# Patient Record
Sex: Female | Born: 1965 | Race: White | Hispanic: No | Marital: Married | Smoking: Never smoker
Health system: Southern US, Community
[De-identification: ages and names within clinical notes are randomized; demographics above are authoritative.]

## PROBLEM LIST (undated history)

## (undated) DIAGNOSIS — E063 Autoimmune thyroiditis: Secondary | ICD-10-CM

## (undated) DIAGNOSIS — Z9889 Other specified postprocedural states: Secondary | ICD-10-CM

## (undated) DIAGNOSIS — R112 Nausea with vomiting, unspecified: Secondary | ICD-10-CM

## (undated) DIAGNOSIS — Z9221 Personal history of antineoplastic chemotherapy: Secondary | ICD-10-CM

## (undated) DIAGNOSIS — K219 Gastro-esophageal reflux disease without esophagitis: Secondary | ICD-10-CM

## (undated) DIAGNOSIS — E039 Hypothyroidism, unspecified: Secondary | ICD-10-CM

## (undated) DIAGNOSIS — C50919 Malignant neoplasm of unspecified site of unspecified female breast: Secondary | ICD-10-CM

## (undated) DIAGNOSIS — E079 Disorder of thyroid, unspecified: Secondary | ICD-10-CM

## (undated) HISTORY — PX: OTHER SURGICAL HISTORY: SHX169

## (undated) HISTORY — DX: Disorder of thyroid, unspecified: E07.9

## (undated) HISTORY — PX: ABDOMINAL HYSTERECTOMY: SHX81

---

## 1998-12-31 ENCOUNTER — Other Ambulatory Visit: Admission: RE | Admit: 1998-12-31 | Discharge: 1998-12-31 | Payer: Self-pay

## 2000-01-08 ENCOUNTER — Other Ambulatory Visit: Admission: RE | Admit: 2000-01-08 | Discharge: 2000-01-08 | Payer: Self-pay | Admitting: Obstetrics & Gynecology

## 2000-12-09 ENCOUNTER — Other Ambulatory Visit: Admission: RE | Admit: 2000-12-09 | Discharge: 2000-12-09 | Payer: Self-pay | Admitting: Obstetrics & Gynecology

## 2001-11-21 ENCOUNTER — Other Ambulatory Visit: Admission: RE | Admit: 2001-11-21 | Discharge: 2001-11-21 | Payer: Self-pay | Admitting: Obstetrics & Gynecology

## 2002-12-12 ENCOUNTER — Other Ambulatory Visit: Admission: RE | Admit: 2002-12-12 | Discharge: 2002-12-12 | Payer: Self-pay | Admitting: Obstetrics & Gynecology

## 2003-12-10 ENCOUNTER — Other Ambulatory Visit: Admission: RE | Admit: 2003-12-10 | Discharge: 2003-12-10 | Payer: Self-pay | Admitting: Obstetrics & Gynecology

## 2005-03-12 ENCOUNTER — Other Ambulatory Visit: Admission: RE | Admit: 2005-03-12 | Discharge: 2005-03-12 | Payer: Self-pay | Admitting: Obstetrics & Gynecology

## 2008-06-19 ENCOUNTER — Encounter: Admission: RE | Admit: 2008-06-19 | Discharge: 2008-06-19 | Payer: Self-pay | Admitting: Obstetrics & Gynecology

## 2008-12-25 ENCOUNTER — Encounter: Admission: RE | Admit: 2008-12-25 | Discharge: 2008-12-25 | Payer: Self-pay | Admitting: Obstetrics & Gynecology

## 2009-06-11 ENCOUNTER — Encounter: Admission: RE | Admit: 2009-06-11 | Discharge: 2009-06-11 | Payer: Self-pay | Admitting: Obstetrics & Gynecology

## 2009-08-17 HISTORY — PX: MASTECTOMY: SHX3

## 2009-11-21 ENCOUNTER — Encounter: Admission: RE | Admit: 2009-11-21 | Discharge: 2009-11-21 | Payer: Self-pay | Admitting: Obstetrics & Gynecology

## 2010-05-20 ENCOUNTER — Encounter: Admission: RE | Admit: 2010-05-20 | Discharge: 2010-05-20 | Payer: Self-pay | Admitting: Obstetrics & Gynecology

## 2010-09-23 ENCOUNTER — Other Ambulatory Visit: Payer: Self-pay | Admitting: Endocrinology

## 2010-09-23 DIAGNOSIS — E049 Nontoxic goiter, unspecified: Secondary | ICD-10-CM

## 2010-11-17 ENCOUNTER — Ambulatory Visit
Admission: RE | Admit: 2010-11-17 | Discharge: 2010-11-17 | Disposition: A | Discharge status: Home / Self Care | Payer: BC Managed Care – HMO | Source: Ambulatory Visit | Attending: Endocrinology | Admitting: Endocrinology

## 2010-11-17 DIAGNOSIS — E049 Nontoxic goiter, unspecified: Secondary | ICD-10-CM

## 2013-03-02 ENCOUNTER — Other Ambulatory Visit: Payer: Self-pay | Admitting: Obstetrics & Gynecology

## 2013-03-02 DIAGNOSIS — N6452 Nipple discharge: Secondary | ICD-10-CM

## 2013-03-17 ENCOUNTER — Other Ambulatory Visit: Payer: Self-pay | Admitting: Obstetrics & Gynecology

## 2013-03-17 ENCOUNTER — Ambulatory Visit
Admission: RE | Admit: 2013-03-17 | Discharge: 2013-03-17 | Disposition: A | Discharge status: Home / Self Care | Payer: BC Managed Care – PPO | Source: Ambulatory Visit | Attending: Obstetrics & Gynecology | Admitting: Obstetrics & Gynecology

## 2013-03-17 DIAGNOSIS — N6452 Nipple discharge: Secondary | ICD-10-CM

## 2013-03-20 ENCOUNTER — Ambulatory Visit
Admission: RE | Admit: 2013-03-20 | Discharge: 2013-03-20 | Disposition: A | Discharge status: Home / Self Care | Payer: BC Managed Care – PPO | Source: Ambulatory Visit | Attending: Obstetrics & Gynecology | Admitting: Obstetrics & Gynecology

## 2013-03-20 ENCOUNTER — Other Ambulatory Visit: Payer: Self-pay | Admitting: Obstetrics & Gynecology

## 2013-03-20 DIAGNOSIS — N6452 Nipple discharge: Secondary | ICD-10-CM

## 2013-03-20 DIAGNOSIS — C50912 Malignant neoplasm of unspecified site of left female breast: Secondary | ICD-10-CM

## 2013-03-20 DIAGNOSIS — N6489 Other specified disorders of breast: Secondary | ICD-10-CM

## 2013-03-21 ENCOUNTER — Telehealth: Payer: Self-pay | Admitting: *Deleted

## 2013-03-21 DIAGNOSIS — C50512 Malignant neoplasm of lower-outer quadrant of left female breast: Secondary | ICD-10-CM | POA: Insufficient documentation

## 2013-03-21 NOTE — Telephone Encounter (Signed)
Called and spoke with patient and confirmed [facility] appt. For 03/29/13.  Instructions and contact information given.

## 2013-03-24 ENCOUNTER — Ambulatory Visit
Admission: RE | Admit: 2013-03-24 | Discharge: 2013-03-24 | Disposition: A | Discharge status: Home / Self Care | Payer: BC Managed Care – PPO | Source: Ambulatory Visit | Attending: Obstetrics & Gynecology | Admitting: Obstetrics & Gynecology

## 2013-03-24 DIAGNOSIS — C50912 Malignant neoplasm of unspecified site of left female breast: Secondary | ICD-10-CM

## 2013-03-24 MED ORDER — GADOBENATE DIMEGLUMINE 529 MG/ML IV SOLN: 13.0000 mL | Freq: Once | INTRAVENOUS | Status: AC | PRN

## 2013-03-29 ENCOUNTER — Encounter: Payer: Self-pay | Admitting: Oncology

## 2013-03-29 ENCOUNTER — Encounter: Payer: Self-pay | Admitting: *Deleted

## 2013-03-29 ENCOUNTER — Other Ambulatory Visit (HOSPITAL_BASED_OUTPATIENT_CLINIC_OR_DEPARTMENT_OTHER): Payer: BC Managed Care – PPO | Admitting: Lab

## 2013-03-29 ENCOUNTER — Ambulatory Visit (HOSPITAL_BASED_OUTPATIENT_CLINIC_OR_DEPARTMENT_OTHER): Payer: BC Managed Care – PPO | Admitting: Oncology

## 2013-03-29 ENCOUNTER — Ambulatory Visit (HOSPITAL_BASED_OUTPATIENT_CLINIC_OR_DEPARTMENT_OTHER): Payer: BC Managed Care – PPO | Admitting: General Surgery

## 2013-03-29 ENCOUNTER — Ambulatory Visit
Admission: RE | Admit: 2013-03-29 | Discharge: 2013-03-29 | Disposition: A | Discharge status: Home / Self Care | Payer: BC Managed Care – PPO | Source: Ambulatory Visit | Attending: Radiation Oncology | Admitting: Radiation Oncology

## 2013-03-29 ENCOUNTER — Encounter (INDEPENDENT_AMBULATORY_CARE_PROVIDER_SITE_OTHER): Payer: Self-pay | Admitting: General Surgery

## 2013-03-29 ENCOUNTER — Encounter: Payer: Self-pay | Admitting: Radiation Oncology

## 2013-03-29 ENCOUNTER — Encounter: Payer: BC Managed Care – PPO | Admitting: Genetic Counselor

## 2013-03-29 ENCOUNTER — Ambulatory Visit: Payer: BC Managed Care – PPO

## 2013-03-29 VITALS — BP 157/90 | HR 92 | Temp 98.4°F | Resp 18 | Ht 65.0 in | Wt 137.0 lb

## 2013-03-29 DIAGNOSIS — C50519 Malignant neoplasm of lower-outer quadrant of unspecified female breast: Secondary | ICD-10-CM

## 2013-03-29 DIAGNOSIS — C50512 Malignant neoplasm of lower-outer quadrant of left female breast: Secondary | ICD-10-CM

## 2013-03-29 DIAGNOSIS — Z170 Estrogen receptor positive status [ER+]: Secondary | ICD-10-CM

## 2013-03-29 DIAGNOSIS — C50919 Malignant neoplasm of unspecified site of unspecified female breast: Secondary | ICD-10-CM

## 2013-03-29 LAB — COMPREHENSIVE METABOLIC PANEL (CC13)
ALT: 14 U/L (ref 0–55)
Albumin: 4.3 g/dL (ref 3.5–5.0)
Alkaline Phosphatase: 58 U/L (ref 40–150)
Glucose: 94 mg/dl (ref 70–140)
Potassium: 4.1 mEq/L (ref 3.5–5.1)
Sodium: 142 mEq/L (ref 136–145)
Total Bilirubin: 0.55 mg/dL (ref 0.20–1.20)
Total Protein: 7.3 g/dL (ref 6.4–8.3)

## 2013-03-29 LAB — CBC WITH DIFFERENTIAL/PLATELET
BASO%: 0.4 % (ref 0.0–2.0)
Eosinophils Absolute: 0 10*3/uL (ref 0.0–0.5)
MCHC: 33.7 g/dL (ref 31.5–36.0)
MCV: 89 fL (ref 79.5–101.0)
MONO#: 0.3 10*3/uL (ref 0.1–0.9)
MONO%: 4.2 % (ref 0.0–14.0)
NEUT#: 5.5 10*3/uL (ref 1.5–6.5)
RBC: 4.62 10*6/uL (ref 3.70–5.45)
RDW: 12.9 % (ref 11.2–14.5)
WBC: 7.6 10*3/uL (ref 3.9–10.3)

## 2013-03-29 NOTE — Progress Notes (Signed)
Checked in new patient with no financial issues. Didn't ask if had packet and if living will or POA. She gave email address but said mail and phone are ok for communication.

## 2013-03-29 NOTE — Progress Notes (Signed)
Radiation Oncology         [phone]) [phone] ________________________________  Initial outpatient Consultation  Name: Amanda Coleman MRN: [id number]  Date: 03/29/2013  DOB: April 13, 1966  WJ:XBJY,N RONALD, MD  Emelia Loron, MD   REFERRING PHYSICIAN: Emelia Loron, MD  DIAGNOSIS: Clinical T2, N0, M0 left breast cancer, ER/PR positive HER-2/neu negative grade 1  HISTORY OF PRESENT ILLNESS::Amanda Coleman is a 47 y.o. female who presented with scant left breast bloody discharge x 3-4 mo.  Mammogram showed dense breast tissue.  On [place], a left breast palpable mass was 3.4cm at 6:00.    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  PREVIOUS RADIATION THERAPY: No  PAST MEDICAL HISTORY:  has a past medical history of Thyroid disease.    PAST SURGICAL HISTORY: Past Surgical History  Procedure Laterality Date  . Abdominal hysterectomy    . Gum graft      FAMILY HISTORY: family history includes Lung cancer in her maternal aunt; Ovarian cancer in her maternal grandmother; Pancreatic cancer in her father; Uterine cancer in her maternal grandmother.  SOCIAL HISTORY:  reports that she has never smoked. She does not have any smokeless tobacco history on file. She reports that she does not drink alcohol or use illicit drugs.  ALLERGIES: Penicillins and Sulfa antibiotics  MEDICATIONS:  Current Outpatient Prescriptions  Medication Sig Dispense Refill  . ALPRAZolam (XANAX) 0.5 MG tablet Take 0.5 mg by mouth as needed for sleep.      . clindamycin (CLEOCIN) 150 MG capsule       . Levothyroxine Sodium 112 MCG CAPS Take 1 each by mouth daily before breakfast.      . mupirocin ointment (BACTROBAN) 2 %       . zolpidem (AMBIEN) 5 MG tablet Take 5 mg by mouth at bedtime as  needed for sleep.       No current facility-administered medications for this encounter.    REVIEW OF SYSTEMS:  Pertinent items are noted in HPI.   PHYSICAL EXAM:   Vitals - 1 value per visit 03/29/2013  SYSTOLIC 157  DIASTOLIC 90  PULSE 92  TEMPERATURE 98.4  RESPIRATIONS 18  Weight (lb) 137  HEIGHT 5\' 5"   BMI 22.8  VISIT REPORT    General: Alert and oriented, in no acute distress HEENT: Head is normocephalic.Extraocular movements are intact. Oropharynx is clear. Neck: Neck is supple, no palpable cervical or supraclavicular lymphadenopathy. Heart: Regular in rate and rhythm with no murmurs, rubs, or gallops. Chest: Clear to auscultation bilaterally, with no rhonchi, wheezes, or rales. Abdomen: Soft, nontender, nondistended, with no rigidity or guarding. Extremities: No cyanosis or edema. Lymphatics: No concerning lymphadenopathy. Skin: No concerning lesions. Musculoskeletal: symmetric strength and muscle tone throughout. Neurologic: Cranial nerves II through XII are grossly intact. No obvious focalities. Speech is fluent. Coordination is intact. Psychiatric: Judgment and insight are intact. Affect is appropriate. Breasts: In the lower outer quadrant of the left breast she has a palpable abnormality of approximately 4 cm in greatest dimension. There is associated ecchymoses from biopsy. Otherwise no palpable lesions of concern in either breast or axillary regions    LABORATORY DATA:  Lab Results  Component Value Date   WBC 7.6 03/29/2013   HGB 13.8 03/29/2013   HCT 41.1 03/29/2013  MCV 89.0 03/29/2013   PLT 214 03/29/2013   CMP     Component Value Date/Time   NA 142 03/29/2013 1225   K 4.1 03/29/2013 1225   CO2 23 03/29/2013 1225   GLUCOSE 94 03/29/2013 1225   BUN 8.7 03/29/2013 1225   CREATININE 0.8 03/29/2013 1225   CALCIUM 9.5 03/29/2013 1225   PROT 7.3 03/29/2013 1225   ALBUMIN 4.3 03/29/2013 1225   AST 17 03/29/2013 1225   ALT 14 03/29/2013 1225   ALKPHOS 58 03/29/2013  1225   BILITOT 0.55 03/29/2013 1225         RADIOGRAPHY: US Breast Left  03/17/2013   *RADIOLOGY REPORT*  Clinical Data:  47 year old female with left-sided bloody nipple discharge for approximately 3-4 months.  DIGITAL DIAGNOSTIC LEFT MAMMOGRAM  AND  LEFT BREAST ULTRASOUND:  Comparison:  Previous mammograms  Findings:  ACR Breast Density Category c:  The breast tissue is heterogeneously dense, which may obscure small masses.  CC and MLO views of the left breast were performed to evaluate for spontaneous left-sided bloody nipple discharge.  No definite mammographic abnormalities are seen to account for the left-sided nipple discharge.  Physical examination of the left breast reveals a firm mass at the approximate 6 o'clock position measuring approximately 3-4 cm.  Targeted ultrasound of the subareolar left breast did not reveal any intraductal masses.  Ultrasound of the left breast at approximately six to seven o'clock demonstrates an irregular hypoechoic mass measuring approximately 3.4 x 0.9 x 1.7 cm.  No suspicious lymphadenopathy is seen in the left axilla.  IMPRESSION: Suspicious left breast mass at six to seven o'clock in the left breast measuring up to 3.4 cm.  RECOMMENDATION: Ultrasound-guided biopsy is recommended and this is scheduled for later today 03/17/2013.  I have discussed the findings and recommendations with the patient. Results were also provided in writing at the conclusion of the visit.  If applicable, a reminder letter will be sent to the patient regarding the next appointment.  BI-RADS CATEGORY 5:  Highly suggestive of malignancy - appropriate action should be taken.   Original Report Authenticated By: Edwin Cap, M.D.   Mr Breast Bilateral W Wo Contrast  03/24/2013   *RADIOLOGY REPORT*  Clinical Data:Biopsy-proven left breast cancer six o'clock location.  The patient presented with bloody nipple discharge.  BILATERAL BREAST MRI WITH AND WITHOUT CONTRAST  Technique: Multiplanar,  multisequence MR images of both breasts were obtained prior to and following the intravenous administration of 13ml of Multihance.  THREE-DIMENSIONAL MR IMAGE RENDERING ON INDEPENDENT WORKSTATION: Three-dimensional MR images were rendered by post-processing  of the original MR data on an independent DynaCad workstation. The three-dimensional MR images were interpreted, and findings are reported in the following complete MRI report for this study.  Comparison:  Recent imaging examinations.  FINDINGS:  Breast composition:  d:  The breast tissue is extremely dense, which lowers the sensitivity of mammography.  Background parenchymal enhancement: Marked  Right breast:  No mass or abnormal enhancement.  Left breast:  There is a region of irregular asymmetric enhancement with internal predominant plateau type enhancement kinetics in the left breast six o'clock location with central clip artifact, corresponding to the biopsy-proven breast cancer.  The area of asymmetrically prominent mass-like enhancement is larger than the area seen on ultrasound and measures 5.0 x 3.3 x 2.6 cm.  Lymph nodes:  No abnormal appearing lymph nodes.  Ancillary findings:  None.  IMPRESSION: 5.0 cm area of abnormally asymmetric enhancement with internal clip artifact  in the left breast six o'clock location corresponding to biopsy-proven breast cancer; the area of enhancement suggests the area of involvement is larger than that apparent at ultrasound and mammography, which may in part be due to the patient's dense breast parenchyma.  No other area of abnormal enhancement in either breast, allowing for marked background enhancement in extremely dense breast parenchymal pattern.  RECOMMENDATION: Treatment plan  BI-RADS CATEGORY 6:  Known biopsy-proven malignancy - appropriate action should be taken.   Original Report Authenticated By: Christiana Pellant, M.D.   Mm Digital Diagnostic Unilat L  03/17/2013   *RADIOLOGY REPORT*  Clinical Data:  Status  post ultrasound guided biopsy of the left breast mass at six to seven o'clock.  DIGITAL DIAGNOSTIC LEFT MAMMOGRAM  Comparison:  Previous exams.  Findings:  Films are performed following ultrasound-guided biopsy of the left breast mass at 6-7 o'clock. A top hat is clip is present in the left breast, in appropriate position.  IMPRESSION: Top hat clip in appropriate position post ultrasound guided biopsy of a left breast mass at six to seven o'clock.   Original Report Authenticated By: Edwin Cap, M.D.   Mm Digital Diagnostic Unilat L  03/17/2013   *RADIOLOGY REPORT*  Clinical Data:  47 year old female with left-sided bloody nipple discharge for approximately 3-4 months.  DIGITAL DIAGNOSTIC LEFT MAMMOGRAM  AND  LEFT BREAST ULTRASOUND:  Comparison:  Previous mammograms  Findings:  ACR Breast Density Category c:  The breast tissue is heterogeneously dense, which may obscure small masses.  CC and MLO views of the left breast were performed to evaluate for spontaneous left-sided bloody nipple discharge.  No definite mammographic abnormalities are seen to account for the left-sided nipple discharge.  Physical examination of the left breast reveals a firm mass at the approximate 6 o'clock position measuring approximately 3-4 cm.  Targeted ultrasound of the subareolar left breast did not reveal any intraductal masses.  Ultrasound of the left breast at approximately six to seven o'clock demonstrates an irregular hypoechoic mass measuring approximately 3.4 x 0.9 x 1.7 cm.  No suspicious lymphadenopathy is seen in the left axilla.  IMPRESSION: Suspicious left breast mass at six to seven o'clock in the left breast measuring up to 3.4 cm.  RECOMMENDATION: Ultrasound-guided biopsy is recommended and this is scheduled for later today 03/17/2013.  I have discussed the findings and recommendations with the patient. Results were also provided in writing at the conclusion of the visit.  If applicable, a reminder letter will be  sent to the patient regarding the next appointment.  BI-RADS CATEGORY 5:  Highly suggestive of malignancy - appropriate action should be taken.   Original Report Authenticated By: Edwin Cap, M.D.   Mm Digital Diagnostic Unilat R  03/20/2013   *RADIOLOGY REPORT*  Clinical Data:  Recently diagnosed left breast cancer.  Pre MRI evaluation.  DIGITAL DIAGNOSTIC RIGHT MAMMOGRAM WITH CAD  Comparison: Previous examinations.  Findings:  ACR Breast Density Category d:  The breast tissue is extremely dense, which lowers the sensitivity of mammography.  Mammographic views of the right breast demonstrate normal appearing breast tissue with no findings suspicious for malignancy.  Mammographic images were processed with CAD.  IMPRESSION: No evidence of malignancy.  RECOMMENDATION: Treatment plan for the patient's recently diagnosed left breast cancer.  I have discussed the findings and recommendations with the patient. Results were also provided in writing at the conclusion of the visit.  If applicable, a reminder letter will be sent to the patient regarding her next appointment.  BI-RADS CATEGORY 1:  Negative.   Original Report Authenticated By: Beckie Salts, M.D.   Mm Radiologist Eval And Mgmt  03/20/2013   *RADIOLOGY REPORT*  ESTABLISHED PATIENT OFFICE VISIT - LEVEL II [id number])  Chief Complaint:  Status post left breast ultrasound guided core needle biopsy.  History:  The patient reported some bleeding following the biopsy which subsequently resolved.  She reports no pain at the biopsy site today.  Exam:  The patient has bruising in the inferior aspect of the left breast, medial to the skin entry site.  No palpable hematoma.  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  Assessment and Plan:  The final pathological diagnosis is concordant with the imaging findings.  The patient was given an appointment for a bilateral breast MR without and with contrast at [facility] at 12:45 p.m. on 03/24/2013.  She will be seen in the Multidisciplinary Clinic on 03/29/2013.   Original Report Authenticated By: Beckie Salts, M.D.   [place] Lt Breast Bx W Loc Dev 1st Lesion Img Bx Spec US Guide  03/17/2013   *RADIOLOGY REPORT*  Clinical Data:  47 year old female with a suspicious left breast mass.  ULTRASOUND GUIDED VACUUM ASSISTED CORE BIOPSY OF THE LEFT BREAST  Comparison: Previous exams.  I met with the patient and we discussed the procedure of ultrasound- guided biopsy, including benefits and alternatives.  We discussed the high likelihood of a successful procedure. We discussed the risks of the procedure including infection, bleeding, tissue injury, clip migration, and inadequate sampling.  Informed written consent was given. The usual time-out protocol was performed immediately prior to the procedure.  Using sterile technique and 2% Lidocaine as local anesthetic, under direct ultrasound visualization, a 12 gauge vacuum-assisted device was used to perform biopsy of the highly suspicious left breast mass at six to seven o'clock using a medial approach. At the conclusion of the procedure, a a top hat tissue marker clip was deployed into the biopsy cavity.  Follow-up 2-view mammogram was performed and dictated separately.  IMPRESSION: Ultrasound-guided biopsy of the highly suspicious left breast mass located at six to seven o'clock.  No apparent complications.   Original Report Authenticated By: Edwin Cap, M.D.      IMPRESSION/PLAN: This is a lovely 47 year old woman with clinical T2, N0, M0 left breast cancer. Since this is a low-grade ER/PR positive cancer it is not likely to respond very well to neoadjuvant chemotherapy.  Therefore recommendations were made for mastectomy, and reconstruction if the patient desires reconstruction. Oncotype testing will determine whether she needs chemotherapy. Radiotherapy would only be indicated after mastectomy in the Chern of a positive  margin, positive lymph node(s), or if the tumor is significantly larger than anticipated. A referral will be made to genetics.  It was a pleasure meeting the patient today. We discussed the risks, benefits, and side effects of radiotherapy; however, I think it's unlikely that she will need radiotherapy after mastectomy. We will discuss her pathology results at the multidisciplinary conference. I wished her the very best.   I spent 25 minutes  face to face with the patient and more than 50% of that time was spent in counseling and/or coordination of care.    __________________________________________   Lonie Peak, MD

## 2013-03-29 NOTE — Progress Notes (Signed)
Patient ID: Amanda Coleman, female   DOB: 1966/01/10, 47 y.o.   MRN: [id number]  Chief Complaint  Patient presents with  . breast cancer    HPI Amanda Coleman is a 47 y.o. female.  Referred by Dr Konrad Dolores HPI 55 yof who was scheduling routine annual exam with Dr Jennette Kettle and told his nurse she had been having some bloody left nipple discharge.  She was seen quickly and referred for mm.  Her mm does not really show an abnormality as her breasts are extremely dense.  An u/s was performed in area of a palpable mass that showed at least a 3.4x0.9x1.7 cm mass.  MRI shows 5 x3.3x2.6 cm area of asymmetric enhancement but no real mass.  There are no abnormal nodes or right breast abnormalities.  Biopsy was performed that shows grade I invasive ductal carcinoma that is er/pr positive, her 2 negative and Ki is 11%.  She comes in today to discuss her options.  Past Medical History  Diagnosis Date  . Thyroid disease     Past Surgical History  Procedure Laterality Date  . Abdominal hysterectomy    . Gum graft      Family History  Problem Relation Age of Onset  . Pancreatic cancer Father   . Lung cancer Maternal Aunt   . Uterine cancer Maternal Grandmother   . Ovarian cancer Maternal Grandmother     Social History History  Substance Use Topics  . Smoking status: Never Smoker   . Smokeless tobacco: Not on file  . Alcohol Use: No    Allergies  Allergen Reactions  . Penicillins Swelling  . Sulfa Antibiotics Rash    Current Outpatient Prescriptions  Medication Sig Dispense Refill  . ALPRAZolam (XANAX) 0.5 MG tablet Take 0.5 mg by mouth as needed for sleep.      . clindamycin (CLEOCIN) 150 MG capsule       . Levothyroxine Sodium 112 MCG CAPS Take 1 each by mouth daily before breakfast.      . mupirocin ointment (BACTROBAN) 2 %       . zolpidem (AMBIEN) 5 MG tablet Take 5 mg by mouth at bedtime as needed for sleep.       No current facility-administered medications for this visit.    Review of  Systems Review of Systems  Constitutional: Negative for fever, chills and unexpected weight change.  HENT: Positive for sinus pressure. Negative for hearing loss, congestion, sore throat, trouble swallowing and voice change.   Eyes: Negative for visual disturbance.  Respiratory: Negative for cough and wheezing.   Cardiovascular: Negative for chest pain, palpitations and leg swelling.  Gastrointestinal: Negative for nausea, vomiting, abdominal pain, diarrhea, constipation, blood in stool, abdominal distention and anal bleeding.  Genitourinary: Negative for hematuria, vaginal bleeding and difficulty urinating.  Musculoskeletal: Positive for arthralgias.  Skin: Negative for rash and wound.  Neurological: Negative for seizures, syncope and headaches.  Hematological: Negative for adenopathy. Does not bruise/bleed easily.  Psychiatric/Behavioral: Negative for confusion.    There were no vitals taken for this visit.  Physical Exam Physical Exam  Vitals reviewed. Constitutional: She appears well-developed and well-nourished.  Neck: Neck supple.  Cardiovascular: Normal rate, regular rhythm and normal heart sounds.   Pulmonary/Chest: Effort normal and breath sounds normal. She has no wheezes. She has no rales. Right breast exhibits no inverted nipple, no mass, no nipple discharge, no skin change and no tenderness. Left breast exhibits mass. Left breast exhibits no inverted nipple, no nipple discharge, no  skin change and no tenderness.    Abdominal: Soft.  Lymphadenopathy:    She has no cervical adenopathy.    Data Reviewed BILATERAL BREAST MRI WITH AND WITHOUT CONTRAST  Technique: Multiplanar, multisequence MR images of both breasts  were obtained prior to and following the intravenous administration  of 13ml of Multihance.  THREE-DIMENSIONAL MR IMAGE RENDERING ON INDEPENDENT WORKSTATION:  Three-dimensional MR images were rendered by post-processing of  the original MR data on an  independent DynaCad workstation. The  three-dimensional MR images were interpreted, and findings are  reported in the following complete MRI report for this study.  Comparison: Recent imaging examinations.  FINDINGS:  Breast composition: d: The breast tissue is extremely dense,  which lowers the sensitivity of mammography.  Background parenchymal enhancement: Marked  Right breast: No mass or abnormal enhancement.  Left breast: There is a region of irregular asymmetric enhancement  with internal predominant plateau type enhancement kinetics in the  left breast six o'clock location with central clip artifact,  corresponding to the biopsy-proven breast cancer. The area of  asymmetrically prominent mass-like enhancement is larger than the  area seen on ultrasound and measures 5.0 x 3.3 x 2.6 cm.  Lymph nodes: No abnormal appearing lymph nodes.  Ancillary findings: None.  IMPRESSION:  5.0 cm area of abnormally asymmetric enhancement with internal clip  artifact in the left breast six o'clock location corresponding to  biopsy-proven breast cancer; the area of enhancement suggests the  area of involvement is larger than that apparent at ultrasound and  mammography, which may in part be due to the patient's dense breast  parenchyma. No other area of abnormal enhancement in either  breast, allowing for marked background enhancement in extremely  dense breast parenchymal pattern.   Assessment    Left breast cancer    Plan    Left skin sparing mastectomy, left axillary sentinel node biopsy    I think due to size of mass and breast size (as well as location in lower inner quadrant) it will be difficult to convert her to a lumpectomy. Her tumor is larger but biologically not very aggressive making less likely to shrink with chemotherapy and I think antiestrogen therapy would be unlikely to convert.  I discussed a mastectomy with her today.  I don't think a nipple sparing mastectomy is good  option for her tumor due to her exam and mr.  I do think a skin sparing mastectomy combined with immediate reconstruction would be reasonable. I am also concerned the inability of mm to pick this up and her breast density.  She could be followed with mm and mr though. I will have her see plastic surgery this week for evaluation.  We also discussed role of bilateral mastectomies.  I told her there are some reasons including symmetry and detection but there is probably not a survival advantage (barring a genetic abnormality) for this.  We have decided to try to do genetic testing then proceed likely with above procedure combined with plastic surgery. We discussed the staging and pathophysiology of breast cancer. We discussed all of the different options for treatment for breast cancer including surgery, chemotherapy, radiation therapy, Herceptin, and antiestrogen therapy.   We discussed a sentinel lymph node biopsy as she does not appear to having lymph node involvement right now. We discussed the performance of that with injection of radioactive tracer and blue dye. We discussed that she would have an incision underneath her axillary hairline. We discussed that there is a  bout a 10-20% chance of having a positive node with a sentinel lymph node biopsy and we will await the permanent pathology to make any other first further decisions in terms of her treatment. One of these options might be to return to the operating room to perform an axillary lymph node dissection. We discussed about a 1-2% risk lifetime of chronic shoulder pain as well as lymphedema associated with a sentinel lymph node biopsy.  We discussed that mastectomy does not reduce local recurrence rate to 0. We discussed the risks of operation including bleeding, infection, possible reoperation. She understands her further therapy will be based on what her stages at the time of her operation.     Hisao Doo 03/29/2013, 2:41 PM

## 2013-03-29 NOTE — Progress Notes (Signed)
Amanda Coleman [id number] Apr 27, 1966 47 y.o. 03/29/2013 9:49 PM  CC  Freddy Finner, MD [address] [place] [address] Dr. Lonie Peak Dr. 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 Patient is seen in the multidisciplinary clinic for discussion of treatment options.  STAGE:   Breast cancer of lower-outer quadrant of left female breast   Primary site: Breast (Left)   Staging method: AJCC 7th Edition   Clinical: Stage IIA (T2, N0, cM0)   Summary: Stage IIA (T2, N0, cM0)  REFERRING PHYSICIAN: Dr. Emelia Loron  HISTORY OF PRESENT ILLNESS:  Amanda Coleman is a 47 y.o. female.  Without significant past medical history who for about 3-4 months experienced a left nipple bloody discharge. She recently had a mammogram that did not show a mass. She had a physical examination which did show a mass measuring 3.4 cm at the 7:00 position. MRI revealed the mass to be 5.0 cm. She went on to have a biopsy performed at the 7:00 position that showed invasive ductal carcinoma grade 1. Prognostic markers revealed the tumor to be estrogen receptor positive progesterone receptor positive HER-2/neu negative with a low Ki-67 11%. Issues [place] was discussed at the multidisciplinary breast conference she is now seen in the multidisciplinary breast clinic for discussion of treatment options. Her pathology and radiology were reviewed. She is without any complaints she is accompanied by her husband today.   Past Medical History: Past Medical History  Diagnosis Date  . Thyroid disease     Past Surgical History: Past Surgical History  Procedure Laterality Date  . Abdominal hysterectomy    . Gum graft      Family History: Family History  Problem Relation Age of Onset  . Pancreatic cancer Father   . Lung cancer Maternal Aunt   . Uterine cancer Maternal Grandmother   . Ovarian cancer  Maternal Grandmother     Social History History  Substance Use Topics  . Smoking status: Never Smoker   . Smokeless tobacco: Not on file  . Alcohol Use: No    Allergies: Allergies  Allergen Reactions  . Penicillins Swelling  . Sulfa Antibiotics Rash    Current Medications: Current Outpatient Prescriptions  Medication Sig Dispense Refill  . ALPRAZolam (XANAX) 0.5 MG tablet Take 0.5 mg by mouth as needed for sleep.      . Levothyroxine Sodium 112 MCG CAPS Take 1 each by mouth daily before breakfast.      . zolpidem (AMBIEN) 5 MG tablet Take 5 mg by mouth at bedtime as needed for sleep.      . clindamycin (CLEOCIN) 150 MG capsule       . mupirocin ointment (BACTROBAN) 2 %        No current facility-administered medications for this visit.    OB/GYN History:menarche at age 44 she underwent surgical menopause in November 2010. She is nulliparous she has never been on hormone placement therapy.  Fertility Discussion:not applicable Prior History of Cancer:no  Health Maintenance:  Colonoscopyno Bone Densityyour Last PAP smear10/15/2013  ECOG PERFORMANCE STATUS: 0 - Asymptomatic  Genetic Counseling/testing: yes  REVIEW OF SYSTEMS:  Review of Systems  Constitutional: Negative.   HENT: Positive for congestion. Negative for hearing loss and ear discharge.   Eyes: Negative.   Respiratory: Negative.   Cardiovascular: Negative.   Gastrointestinal: Negative.   Genitourinary: Negative.   Musculoskeletal: Negative.   Skin: Negative.   Neurological:  Negative.  Negative for headaches.  Endo/Heme/Allergies: Negative.   Psychiatric/Behavioral: Negative.      PHYSICAL EXAMINATION: Blood pressure 157/90, pulse 92, temperature 98.4 F (36.9 C), temperature source Oral, resp. rate 18, height 5\' 5"  (1.651 m), weight 137 lb (62.143 kg), SpO2 100.00%.  Physical Exam  Constitutional: She is oriented to person, place, and time and well-developed, well-nourished, and in no  distress.  HENT:  Head: Normocephalic and atraumatic.  Nose: Nose normal.  Mouth/Throat: Oropharynx is clear and moist.  Eyes: EOM are normal. Pupils are equal, round, and reactive to light.  Neck: Normal range of motion. Neck supple.  Cardiovascular: Normal rate and regular rhythm.   No murmur heard. Pulmonary/Chest: Effort normal and breath sounds normal.  Abdominal: Soft. Bowel sounds are normal. She exhibits no distension and no mass. There is no tenderness.  Genitourinary:  Not performed   Musculoskeletal: Normal range of motion.  Lymphadenopathy:    She has no cervical adenopathy.  Neurological: She is alert and oriented to person, place, and time. Gait normal.  Skin: Skin is warm and dry.  Psychiatric: Affect normal.  Breasts:right breast no masses nipple discharge or suspicious lesions Left breast reveals palpable mass at the 7:00 position about 4-5 cm.        STUDIES/RESULTS: US Breast Left  Mar 28, 2013   *RADIOLOGY REPORT*  Clinical Data:  47 year old female with left-sided bloody nipple discharge for approximately 3-4 months.  DIGITAL DIAGNOSTIC LEFT MAMMOGRAM  AND  LEFT BREAST ULTRASOUND:  Comparison:  Previous mammograms  Findings:  ACR Breast Density Category c:  The breast tissue is heterogeneously dense, which may obscure small masses.  CC and MLO views of the left breast were performed to evaluate for spontaneous left-sided bloody nipple discharge.  No definite mammographic abnormalities are seen to account for the left-sided nipple discharge.  Physical examination of the left breast reveals a firm mass at the approximate 6 o'clock position measuring approximately 3-4 cm.  Targeted ultrasound of the subareolar left breast did not reveal any intraductal masses.  Ultrasound of the left breast at approximately six to seven o'clock demonstrates an irregular hypoechoic mass measuring approximately 3.4 x 0.9 x 1.7 cm.  No suspicious lymphadenopathy is seen in the left axilla.   IMPRESSION: Suspicious left breast mass at six to seven o'clock in the left breast measuring up to 3.4 cm.  RECOMMENDATION: Ultrasound-guided biopsy is recommended and this is scheduled for later today 2013-03-28.  I have discussed the findings and recommendations with the patient. Results were also provided in writing at the conclusion of the visit.  If applicable, a reminder letter will be sent to the patient regarding the next appointment.  BI-RADS CATEGORY 5:  Highly suggestive of malignancy - appropriate action should be taken.   Original Report Authenticated By: Edwin Cap, M.D.   Mr Breast Bilateral W Wo Contrast  03/24/2013   *RADIOLOGY REPORT*  Clinical Data:Biopsy-proven left breast cancer six o'clock location.  The patient presented with bloody nipple discharge.  BILATERAL BREAST MRI WITH AND WITHOUT CONTRAST  Technique: Multiplanar, multisequence MR images of both breasts were obtained prior to and following the intravenous administration of 13ml of Multihance.  THREE-DIMENSIONAL MR IMAGE RENDERING ON INDEPENDENT WORKSTATION: Three-dimensional MR images were rendered by post-processing  of the original MR data on an independent DynaCad workstation. The three-dimensional MR images were interpreted, and findings are reported in the following complete MRI report for this study.  Comparison:  Recent imaging examinations.  FINDINGS:  Breast composition:  d:  The breast tissue is extremely dense, which lowers the sensitivity of mammography.  Background parenchymal enhancement: Marked  Right breast:  No mass or abnormal enhancement.  Left breast:  There is a region of irregular asymmetric enhancement with internal predominant plateau type enhancement kinetics in the left breast six o'clock location with central clip artifact, corresponding to the biopsy-proven breast cancer.  The area of asymmetrically prominent mass-like enhancement is larger than the area seen on ultrasound and measures 5.0 x 3.3 x 2.6  cm.  Lymph nodes:  No abnormal appearing lymph nodes.  Ancillary findings:  None.  IMPRESSION: 5.0 cm area of abnormally asymmetric enhancement with internal clip artifact in the left breast six o'clock location corresponding to biopsy-proven breast cancer; the area of enhancement suggests the area of involvement is larger than that apparent at ultrasound and mammography, which may in part be due to the patient's dense breast parenchyma.  No other area of abnormal enhancement in either breast, allowing for marked background enhancement in extremely dense breast parenchymal pattern.  RECOMMENDATION: Treatment plan  BI-RADS CATEGORY 6:  Known biopsy-proven malignancy - appropriate action should be taken.   Original Report Authenticated By: Christiana Pellant, M.D.   Mm Digital Diagnostic Unilat L  03/17/2013   *RADIOLOGY REPORT*  Clinical Data:  Status post ultrasound guided biopsy of the left breast mass at six to seven o'clock.  DIGITAL DIAGNOSTIC LEFT MAMMOGRAM  Comparison:  Previous exams.  Findings:  Films are performed following ultrasound-guided biopsy of the left breast mass at 6-7 o'clock. A top hat is clip is present in the left breast, in appropriate position.  IMPRESSION: Top hat clip in appropriate position post ultrasound guided biopsy of a left breast mass at six to seven o'clock.   Original Report Authenticated By: Edwin Cap, M.D.   Mm Digital Diagnostic Unilat L  03/17/2013   *RADIOLOGY REPORT*  Clinical Data:  47 year old female with left-sided bloody nipple discharge for approximately 3-4 months.  DIGITAL DIAGNOSTIC LEFT MAMMOGRAM  AND  LEFT BREAST ULTRASOUND:  Comparison:  Previous mammograms  Findings:  ACR Breast Density Category c:  The breast tissue is heterogeneously dense, which may obscure small masses.  CC and MLO views of the left breast were performed to evaluate for spontaneous left-sided bloody nipple discharge.  No definite mammographic abnormalities are seen to account for the  left-sided nipple discharge.  Physical examination of the left breast reveals a firm mass at the approximate 6 o'clock position measuring approximately 3-4 cm.  Targeted ultrasound of the subareolar left breast did not reveal any intraductal masses.  Ultrasound of the left breast at approximately six to seven o'clock demonstrates an irregular hypoechoic mass measuring approximately 3.4 x 0.9 x 1.7 cm.  No suspicious lymphadenopathy is seen in the left axilla.  IMPRESSION: Suspicious left breast mass at six to seven o'clock in the left breast measuring up to 3.4 cm.  RECOMMENDATION: Ultrasound-guided biopsy is recommended and this is scheduled for later today 03/17/2013.  I have discussed the findings and recommendations with the patient. Results were also provided in writing at the conclusion of the visit.  If applicable, a reminder letter will be sent to the patient regarding the next appointment.  BI-RADS CATEGORY 5:  Highly suggestive of malignancy - appropriate action should be taken.   Original Report Authenticated By: Edwin Cap, M.D.   Mm Digital Diagnostic Unilat R  03/20/2013   *RADIOLOGY REPORT*  Clinical Data:  Recently diagnosed left breast cancer.  Pre MRI  evaluation.  DIGITAL DIAGNOSTIC RIGHT MAMMOGRAM WITH CAD  Comparison: Previous examinations.  Findings:  ACR Breast Density Category d:  The breast tissue is extremely dense, which lowers the sensitivity of mammography.  Mammographic views of the right breast demonstrate normal appearing breast tissue with no findings suspicious for malignancy.  Mammographic images were processed with CAD.  IMPRESSION: No evidence of malignancy.  RECOMMENDATION: Treatment plan for the patient's recently diagnosed left breast cancer.  I have discussed the findings and recommendations with the patient. Results were also provided in writing at the conclusion of the visit.  If applicable, a reminder letter will be sent to the patient regarding her next appointment.   BI-RADS CATEGORY 1:  Negative.   Original Report Authenticated By: Beckie Salts, M.D.   Mm Radiologist Eval And Mgmt  03/20/2013   *RADIOLOGY REPORT*  ESTABLISHED PATIENT OFFICE VISIT - LEVEL II [phone])  Chief Complaint:  Status post left breast ultrasound guided core needle biopsy.  History:  The patient reported some bleeding following the biopsy which subsequently resolved.  She reports no pain at the biopsy site today.  Exam:  The patient has bruising in the inferior aspect of the left breast, medial to the skin entry site.  No palpable hematoma.  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  Assessment and Plan:  The final pathological diagnosis is concordant with the imaging findings.  The patient was given an appointment for a bilateral breast MR without and with contrast at [facility] at 12:45 p.m. on 03/24/2013.  She will be seen in the Multidisciplinary Clinic on 03/29/2013.   Original Report Authenticated By: Beckie Salts, M.D.   [place] Lt Breast Bx W Loc Dev 1st Lesion Img Bx Spec US Guide  03/17/2013   *RADIOLOGY REPORT*  Clinical Data:  47 year old female with a suspicious left breast mass.  ULTRASOUND GUIDED VACUUM ASSISTED CORE BIOPSY OF THE LEFT BREAST  Comparison: Previous exams.  I met with the patient and we discussed the procedure of ultrasound- guided biopsy, including benefits and alternatives.  We discussed the high likelihood of a successful procedure. We discussed the risks of the procedure including infection, bleeding, tissue injury, clip migration, and inadequate sampling.  Informed written consent was given. The usual time-out protocol was performed immediately prior to the procedure.  Using sterile technique and 2% Lidocaine as local anesthetic, under direct ultrasound visualization, a 12 gauge vacuum-assisted device was used to perform biopsy of the highly suspicious left breast mass at six to seven o'clock using a medial  approach. At the conclusion of the procedure, a a top hat tissue marker clip was deployed into the biopsy cavity.  Follow-up 2-view mammogram was performed and dictated separately.  IMPRESSION: Ultrasound-guided biopsy of the highly suspicious left breast mass located at six to seven o'clock.  No apparent complications.   Original Report Authenticated By: Edwin Cap, M.D.     LABS:    Chemistry      Component Value Date/Time   NA 142 03/29/2013 1225   K 4.1 03/29/2013 1225   CO2 23 03/29/2013 1225   BUN 8.7 03/29/2013 1225   CREATININE 0.8 03/29/2013 1225      Component Value Date/Time   CALCIUM 9.5 03/29/2013 1225   ALKPHOS 58 03/29/2013 1225   AST 17 03/29/2013 1225   ALT 14 03/29/2013 1225   BILITOT 0.55 03/29/2013 1225      Lab Results  Component Value Date   WBC 7.6  03/29/2013   HGB 13.8 03/29/2013   HCT 41.1 03/29/2013   MCV 89.0 03/29/2013   PLT 214 03/29/2013   PATHOLOGY: ADDITIONAL INFORMATION: PROGNOSTIC INDICATORS - ACIS Results: IMMUNOHISTOCHEMICAL AND MORPHOMETRIC ANALYSIS BY THE AUTOMATED CELLULAR IMAGING SYSTEM (ACIS) Estrogen Receptor: 100%, POSITIVE, STRONG STAINING INTENSITY Progesterone Receptor: 100%, POSITIVE, STRONG STAINING INTENSITY Proliferation Marker Ki67: 11% REFERENCE RANGE ESTROGEN RECEPTOR NEGATIVE <1% POSITIVE =>1% PROGESTERONE RECEPTOR NEGATIVE <1% POSITIVE =>1% All controls stained appropriately Pecola Leisure MD Pathologist, Electronic Signature ( Signed 03/22/2013) CHROMOGENIC IN-SITU HYBRIDIZATION Results: HER-2/NEU BY CISH - NO AMPLIFICATION OF HER-2 DETECTED. RESULT RATIO OF HER2: CEP 17 SIGNALS 1.12 AVERAGE HER2 COPY NUMBER PER CELL 1.90 REFERENCE RANGE 1 of 3 Duplicate copy FINAL for Gerlich, Meganne ([id number]) ADDITIONAL INFORMATION:(continued) NEGATIVE HER2/Chr17 Ratio <2.0 and Average HER2 copy number <4.0 EQUIVOCAL HER2/Chr17 Ratio <2.0 and Average HER2 copy number 4.0 and <6.0 POSITIVE HER2/Chr17 Ratio >=2.0 and/or Average  HER2 copy number >=6.0 Pecola Leisure MD Pathologist, Electronic Signature ( Signed 03/21/2013) FINAL DIAGNOSIS Diagnosis Breast, left, needle core biopsy, 7 o'clock - INVASIVE DUCTAL CARCINOMA. - DUCTAL CARCINOMA IN SITU. - SEE COMMENT. Microscopic Comment The carcinoma appears grade I-II. A breast prognostic profile will be performed and the results reported separately. The results were called to [facility] on 03/20/13. (JBK:gt, 03/20/13) Pecola Leisure MD Pathologist, Electronic Signature ([place] signed 03/20/2013)  ASSESSMENT    47 year old female with  #1 history of left nipple bloody discharge her back in 3-4 months. Patient went on to have a mammogram that did not reveal a mass but physical examination indeed revealed a mass measuring 3.4 cm. MRI confirmed this mass to be about 5.0 cm. Patient went on to have a biopsy that showed an invasive ductal carcinoma grade 1. Prognostic markers revealed the tumor to be ER positive PR positive HER-2/neu negative with a Ki-67 of 11%.  #2 patient is seen in the multidisciplinary breast clinic. We discussed her treatment options we discussed radiology pathology and rationale for treatment. We discussed mastectomy with sentinel lymph node biopsy since patient has a very large area and she would not be a good candidate for lumpectomy.  #3 we discussed Oncotype testing for the tumor to determine whether or not she should receive chemotherapy. We also discussed treatment with antiestrogen therapy since her tumor is estrogen receptor positive. She would be a good candidate for tamoxifen for at least 10 years.  #4 patient and I discussed genetic testing since patient is under 45.  Clinical Trial Eligibility:no Multidisciplinary conference discussion yes     PLAN:    #1 patient will proceed with mastectomy sentinel lymph node biopsy.  #2 with check her tumor for Oncotype DX to see if she will need chemotherapy are not.  #3 she will  go on antiestrogen therapy.  4 patient was seen by radiation oncology for discussion of possible postmastectomy radiation especially if she has a positive sentinel node.  #5 patient will have a genetic testing we discussed the rationale for this.   #6 she will also be seen by plastic for consideration of reconstruction.        Discussion: Patient is being treated per NCCN breast cancer care guidelines appropriate for stage.II   Thank you so much for allowing me to participate in the care of [facility]. I will continue to follow up the patient with you and assist in her care.  All questions were answered. The patient knows to call the clinic with any problems, questions or  concerns. We can certainly see the patient much sooner if necessary.  I spent 40 minutes counseling the patient face to face. The total time spent in the appointment was 40 minutes.  Drue Second, MD Medical/Oncology [facility] [phone] (beeper) [phone] (Office)  03/29/2013, 9:49 PM

## 2013-03-30 ENCOUNTER — Telehealth (INDEPENDENT_AMBULATORY_CARE_PROVIDER_SITE_OTHER): Payer: Self-pay

## 2013-03-30 NOTE — Telephone Encounter (Signed)
Called pt to introduce myself to her since she was seen at the [facility] yesterday with Dr Dwain Sarna. The pt is going to be seeing Dr Kelly Splinter on Friday so I asked for her to call me after the appt to touch base with me so I can get her to the surgery schedulers or make her another appt to see Dr Dwain Sarna if need be. The pt understands and will call me tomorrow.

## 2013-03-31 ENCOUNTER — Other Ambulatory Visit (INDEPENDENT_AMBULATORY_CARE_PROVIDER_SITE_OTHER): Payer: Self-pay | Admitting: General Surgery

## 2013-03-31 ENCOUNTER — Telehealth: Payer: Self-pay | Admitting: Oncology

## 2013-03-31 ENCOUNTER — Encounter: Payer: Self-pay | Admitting: *Deleted

## 2013-03-31 DIAGNOSIS — C50512 Malignant neoplasm of lower-outer quadrant of left female breast: Secondary | ICD-10-CM

## 2013-03-31 NOTE — Progress Notes (Signed)
Faxed records to Dr. Sanger's office.  

## 2013-03-31 NOTE — Telephone Encounter (Signed)
Pt came to the office after her appt with Dr Kelly Splinter this am and she is ready to schedule surgery. The pt is still in agreement with the left skin mastectomy,left axillary sent. Node bx, and reconstruction by Dr Kelly Splinter. I offered the pt another appt with Dr Dwain Sarna for next week but she declined the appt. The pt stated that she would rather talk to on the phone if she has anymore questions. I told her that would be fine if she has any questions to just write them down and to call me. The pt understands.

## 2013-04-03 ENCOUNTER — Telehealth: Payer: Self-pay | Admitting: *Deleted

## 2013-04-03 NOTE — Telephone Encounter (Signed)
Called and spoke with patient from [facility] 03/29/13. She stated how well everything went last week and very appreciative of everyone.  No other questions or concerns at this time.

## 2013-04-06 ENCOUNTER — Encounter (HOSPITAL_COMMUNITY): Payer: Self-pay | Admitting: Pharmacy Technician

## 2013-04-06 ENCOUNTER — Encounter: Payer: Self-pay | Admitting: Specialist

## 2013-04-06 NOTE — Progress Notes (Signed)
I met Amanda Coleman at breast clinic on 03/29/2013.  She did not rate her distress on the scale provided.  I gave her information about available support programs and made a referral for her to [facility].  I also gave her my contact information, should she need assistance.

## 2013-04-06 NOTE — Pre-Procedure Instructions (Signed)
Amanda Coleman  04/06/2013   Your procedure is scheduled on:  Thursday April 13, 2013.  Report to [facility] at 6:00 AM.  Call this number if you have problems the morning of surgery: [phone]   Remember:   Do not eat food or drink liquids after midnight.   Take these medicines the morning of surgery with A SIP OF WATER: Levothyroxine (Synthroid)   Do not take Aspirin, Aleve, Naproxen, Advil, Ibuprofen, Vitamin, Herbs, or Supplements starting today  Do not wear jewelry, make-up or nail polish.  Do not wear lotions, powders, or perfumes. You may wear deodorant.  Do not shave 48 hours prior to surgery.   Do not bring valuables to the hospital.  [facility] is not responsible for any belongings or valuables.  Contacts, dentures or bridgework may not be worn into surgery.  Leave suitcase in the car. After surgery it may be brought to your room.  For patients admitted to the hospital, checkout time is 11:00 AM the day of discharge.   Patients discharged the day of surgery will not be allowed to drive home.  Name and phone number of your driver: Family/Friend  Special Instructions: Shower using CHG 2 nights before surgery and the night before surgery.  If you shower the day of surgery use CHG.  Use special wash - you have one bottle of CHG for all showers.  You should use approximately 1/3 of the bottle for each shower.   Please read over the following fact sheets that you were given: Pain Booklet, Coughing and Deep Breathing and Surgical Site Infection Prevention

## 2013-04-07 ENCOUNTER — Other Ambulatory Visit: Payer: Self-pay | Admitting: Plastic Surgery

## 2013-04-07 ENCOUNTER — Encounter (HOSPITAL_COMMUNITY)
Admission: RE | Admit: 2013-04-07 | Discharge: 2013-04-07 | Disposition: A | Discharge status: Home / Self Care | Payer: BC Managed Care – PPO | Source: Ambulatory Visit | Attending: General Surgery | Admitting: General Surgery

## 2013-04-07 ENCOUNTER — Encounter (HOSPITAL_COMMUNITY): Payer: Self-pay

## 2013-04-07 DIAGNOSIS — Z01812 Encounter for preprocedural laboratory examination: Secondary | ICD-10-CM | POA: Insufficient documentation

## 2013-04-07 DIAGNOSIS — C50912 Malignant neoplasm of unspecified site of left female breast: Secondary | ICD-10-CM

## 2013-04-07 HISTORY — DX: Malignant neoplasm of unspecified site of unspecified female breast: C50.919

## 2013-04-07 HISTORY — DX: Autoimmune thyroiditis: E06.3

## 2013-04-07 HISTORY — DX: Hypothyroidism, unspecified: E03.9

## 2013-04-07 LAB — CBC
Hemoglobin: 14 g/dL (ref 12.0–15.0)
Platelets: 211 10*3/uL (ref 150–400)
RBC: 4.63 MIL/uL (ref 3.87–5.11)
WBC: 7.9 10*3/uL (ref 4.0–10.5)

## 2013-04-07 LAB — BASIC METABOLIC PANEL
CO2: 24 mEq/L (ref 19–32)
Calcium: 9.2 mg/dL (ref 8.4–10.5)
GFR calc non Af Amer: >90 mL/min (ref >90)
Potassium: 4 mEq/L (ref 3.5–5.1)
Sodium: 140 mEq/L (ref 135–145)

## 2013-04-07 NOTE — H&P (Signed)
This document contains confidential information from a [facility] medical record system and may be unauthenticated. Release may be made only with a valid authorization or in accordance with applicable policies of Medical Center or its affiliates. This document must be maintained in a secure manner or discarded/destroyed as required by Medical Center policy or by a confidential means such as shredding.     [name]  03/31/2013 10:30 AM   Office Visit  MRN:  [id number]  Provider: Tymothy Cass Sanger, DO  Department:  Plastic Surgery  Dept Phone: [phone]   Diagnoses    Breast cancer, left (HCC)    -  Primary   174.9      Reason for Visit -  Breast Reconstruction     Vitals - Last Recorded      138/77  81  1.651 m (5' 5")  62.143 kg (137 lb)  22.8 kg/m2       Subjective:    Patient ID: Amanda Coleman is a 47 y.o. female.  HPI The patient is a 47 yrs old female here for evaluation for breast reconstruction.  She was having bloody nipple drainage from her left breast and was due for a mammagram.  She palpated a mass but it did no show on the mammagram.  The ultrasound showed a 3.4 x 0.9 x 1.7 cm mass and the MRI showed a 5 x 3.3 x 2.6 cm area of asymmetric enhancement but no real mass. No abnormal nodes or right breast anomalies were noted.  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 She has thyroid disease and has undergone an abdominal hysterectomy and gum graft.  She is not a smoker.  She is 5 feet 5 inches tall, weighs 137 pounds and wears a 34B bra. She would like to be around the same size.  She does not have a family history of breast cancer and is not going to have genetic testing.  The following portions of the patient's history were reviewed and updated as appropriate: allergies, current medications, past family history, past medical history, past social history, past surgical history and problem list.  Review of Systems   Constitutional: Negative.   HENT: Negative.   Eyes: Negative.   Respiratory: Negative.   Cardiovascular: Negative.   Gastrointestinal: Negative.   Endocrine: Negative.   Genitourinary: Negative.   Neurological: Negative.   Hematological: Negative.   Psychiatric/Behavioral: Negative.         Objective:    Physical Exam  Constitutional: She appears well-developed and well-nourished.  HENT:   Head: Normocephalic and atraumatic.  Eyes: EOM are normal. Pupils are equal, round, and reactive to light.  Cardiovascular: Normal rate.   Pulmonary/Chest: Effort normal.  Abdominal: Soft.  Musculoskeletal: Normal range of motion.  Neurological: She is alert.  Skin: Skin is warm.  Psychiatric: She has a normal mood and affect. Her behavior is normal. Judgment and thought content normal.   Assessment:   1.  Breast cancer, left (HCC)        Plan:     Assessment and Plan:   A long, detailed conversation was had regarding the patient's options for breast reconstruction. Five main points, which are explained to all breast reconstruction patients, were discussed.   1. Breast reconstruction is an optional process.   2. Breast reconstruction is a multi-stage process which involves multiple surgeries spaced several months apart. The entire process can take over one year.     3. The major goal of breast reconstruction is to have the patient look normal in clothing. When naked, there will always be scars.   4. Asymmetries are often present during the reconstruction process. Several operations may be needed, including surgery to the non-cancerous breast, to achieve satisfactory results.   5. No matter the reconstructive method, there are ways that the reconstruction can fail and a secondary reconstructive plan would need to be created.   A general discussion regarding all available methods of breast reconstruction were discussed. The types of reconstructions described included.   1. Tissue expander and  implant based reconstruction, both single and multi-stage approaches.   2. Autologous only reconstructions, including free abdominal-tissue based reconstructions.   3. Combination procedures, particularly latissismus dorsi flaps combined with either expanders or implants.   For each of the reconstruction methods mentioned above, the risks, benefits, alternatives, scarring, and recovery time were discussed in great detail. Specific risks detailed included bleeding, infection, hematoma, seroma, scarring, pain, wound healing complications, flap loss, fat necrosis, capsular contracture, need for implant removal, donor site complications, bulge, hernia, umbilical necrosis, need for urgent reoperation, and need for dressing changes were discussed.    Assessment   Once all reconstruction options were presented, a focused discussion was had regarding the patient's suitability for each of these procedures.   A total of 50 minutes of face-to-face time was spent in this encounter, of which >50% was spent in counseling. She is a good candidate for left expander/Flex HD placement.    

## 2013-04-07 NOTE — Progress Notes (Signed)
Requested Dr. Kelly Splinter place orders for her portion of the operation

## 2013-04-12 MED ORDER — CIPROFLOXACIN IN D5W 400 MG/200ML IV SOLN
400.0000 mg | INTRAVENOUS | Status: DC
  Filled 2013-04-12: qty 200

## 2013-04-12 MED ORDER — VANCOMYCIN HCL IN DEXTROSE 1-5 GM/200ML-% IV SOLN
1000.0000 mg | INTRAVENOUS | Status: AC
  Administered 2013-04-13: 1000 mg via INTRAVENOUS
  Filled 2013-04-12: qty 200

## 2013-04-13 ENCOUNTER — Ambulatory Visit (HOSPITAL_COMMUNITY): Payer: BC Managed Care – PPO | Admitting: Anesthesiology

## 2013-04-13 ENCOUNTER — Inpatient Hospital Stay (HOSPITAL_COMMUNITY)
Admission: RE | Admit: 2013-04-13 | Discharge: 2013-04-14 | DRG: 258 | Disposition: A | Discharge status: Home / Self Care | Payer: BC Managed Care – PPO | Source: Ambulatory Visit | Attending: General Surgery | Admitting: General Surgery

## 2013-04-13 ENCOUNTER — Encounter (HOSPITAL_COMMUNITY): Payer: Self-pay | Admitting: Vascular Surgery

## 2013-04-13 ENCOUNTER — Encounter (HOSPITAL_COMMUNITY): Payer: Self-pay | Admitting: Surgery

## 2013-04-13 ENCOUNTER — Ambulatory Visit (HOSPITAL_COMMUNITY)
Admission: RE | Admit: 2013-04-13 | Discharge: 2013-04-13 | Disposition: A | Discharge status: Home / Self Care | Payer: BC Managed Care – PPO | Source: Ambulatory Visit | Attending: General Surgery | Admitting: General Surgery

## 2013-04-13 ENCOUNTER — Encounter (HOSPITAL_COMMUNITY)
Admission: RE | Disposition: A | Discharge status: Home / Self Care | Payer: Self-pay | Source: Ambulatory Visit | Attending: General Surgery

## 2013-04-13 DIAGNOSIS — Z8041 Family history of malignant neoplasm of ovary: Secondary | ICD-10-CM

## 2013-04-13 DIAGNOSIS — C50319 Malignant neoplasm of lower-inner quadrant of unspecified female breast: Principal | ICD-10-CM | POA: Diagnosis present

## 2013-04-13 DIAGNOSIS — Z170 Estrogen receptor positive status [ER+]: Secondary | ICD-10-CM

## 2013-04-13 DIAGNOSIS — C50512 Malignant neoplasm of lower-outer quadrant of left female breast: Secondary | ICD-10-CM

## 2013-04-13 DIAGNOSIS — Z880 Allergy status to penicillin: Secondary | ICD-10-CM

## 2013-04-13 DIAGNOSIS — Z801 Family history of malignant neoplasm of trachea, bronchus and lung: Secondary | ICD-10-CM

## 2013-04-13 DIAGNOSIS — Z8049 Family history of malignant neoplasm of other genital organs: Secondary | ICD-10-CM

## 2013-04-13 DIAGNOSIS — C50912 Malignant neoplasm of unspecified site of left female breast: Secondary | ICD-10-CM

## 2013-04-13 DIAGNOSIS — D0590 Unspecified type of carcinoma in situ of unspecified breast: Secondary | ICD-10-CM

## 2013-04-13 DIAGNOSIS — Z79899 Other long term (current) drug therapy: Secondary | ICD-10-CM

## 2013-04-13 DIAGNOSIS — Z90710 Acquired absence of both cervix and uterus: Secondary | ICD-10-CM

## 2013-04-13 DIAGNOSIS — Z882 Allergy status to sulfonamides status: Secondary | ICD-10-CM

## 2013-04-13 DIAGNOSIS — E079 Disorder of thyroid, unspecified: Secondary | ICD-10-CM | POA: Diagnosis present

## 2013-04-13 HISTORY — PX: SIMPLE MASTECTOMY WITH AXILLARY SENTINEL NODE BIOPSY: SHX6098

## 2013-04-13 HISTORY — PX: BREAST RECONSTRUCTION WITH PLACEMENT OF TISSUE EXPANDER AND FLEX HD (ACELLULAR HYDRATED DERMIS): SHX6295

## 2013-04-13 HISTORY — PX: MASTECTOMY W/ SENTINEL NODE BIOPSY: SHX2001

## 2013-04-13 SURGERY — MASTECTOMY WITH SENTINEL LYMPH NODE BIOPSY
Anesthesia: General | Laterality: Left | Wound class: Clean

## 2013-04-13 MED ORDER — 0.9 % SODIUM CHLORIDE (POUR BTL) OPTIME
TOPICAL | Status: DC | PRN
  Administered 2013-04-13: 1000 mL

## 2013-04-13 MED ORDER — DEXAMETHASONE SODIUM PHOSPHATE 10 MG/ML IJ SOLN
INTRAMUSCULAR | Status: DC | PRN
  Administered 2013-04-13: 8 mg via INTRAVENOUS

## 2013-04-13 MED ORDER — ACETAMINOPHEN 650 MG RE SUPP: 650.0000 mg | Freq: Four times a day (QID) | RECTAL | Status: DC | PRN

## 2013-04-13 MED ORDER — OXYCODONE HCL 5 MG PO TABS
ORAL_TABLET | ORAL | Status: AC
  Filled 2013-04-13: qty 1

## 2013-04-13 MED ORDER — ACETAMINOPHEN 325 MG PO TABS: 650.0000 mg | ORAL_TABLET | Freq: Four times a day (QID) | ORAL | Status: DC | PRN

## 2013-04-13 MED ORDER — KCL IN DEXTROSE-NACL 20-5-0.45 MEQ/L-%-% IV SOLN
INTRAVENOUS | Status: DC
  Administered 2013-04-13 – 2013-04-14 (×2): via INTRAVENOUS
  Filled 2013-04-13 (×7): qty 1000

## 2013-04-13 MED ORDER — LIDOCAINE HCL (CARDIAC) 20 MG/ML IV SOLN
INTRAVENOUS | Status: DC | PRN
  Administered 2013-04-13: 70 mg via INTRAVENOUS
  Administered 2013-04-13: 100 mg via INTRATRACHEAL

## 2013-04-13 MED ORDER — HYDROMORPHONE HCL PF 1 MG/ML IJ SOLN
0.2500 mg | INTRAMUSCULAR | Status: DC | PRN
Start: 2013-04-13 — End: 2013-04-13
  Administered 2013-04-13 (×4): 0.5 mg via INTRAVENOUS

## 2013-04-13 MED ORDER — SODIUM CHLORIDE 0.9 % IV SOLN
INTRAVENOUS | Status: DC | PRN
  Administered 2013-04-13: 08:00:00 via INTRAVENOUS

## 2013-04-13 MED ORDER — ONDANSETRON HCL 4 MG/2ML IJ SOLN
4.0000 mg | Freq: Four times a day (QID) | INTRAMUSCULAR | Status: DC | PRN
  Administered 2013-04-13 (×2): 4 mg via INTRAVENOUS
  Filled 2013-04-13 (×2): qty 2

## 2013-04-13 MED ORDER — ONDANSETRON HCL 4 MG/2ML IJ SOLN: 4.0000 mg | Freq: Four times a day (QID) | INTRAMUSCULAR | Status: DC | PRN

## 2013-04-13 MED ORDER — ONDANSETRON HCL 4 MG/2ML IJ SOLN
INTRAMUSCULAR | Status: DC | PRN
  Administered 2013-04-13: 4 mg via INTRAVENOUS

## 2013-04-13 MED ORDER — SODIUM CHLORIDE 0.9 % IR SOLN
Status: DC | PRN
  Administered 2013-04-13: 09:00:00

## 2013-04-13 MED ORDER — LACTATED RINGERS IV SOLN
INTRAVENOUS | Status: DC
  Administered 2013-04-13 (×2): via INTRAVENOUS

## 2013-04-13 MED ORDER — PHENYLEPHRINE HCL 10 MG/ML IJ SOLN
INTRAMUSCULAR | Status: DC | PRN
  Administered 2013-04-13: 80 ug via INTRAVENOUS
  Administered 2013-04-13 (×2): 40 ug via INTRAVENOUS

## 2013-04-13 MED ORDER — FENTANYL CITRATE 0.05 MG/ML IJ SOLN
INTRAMUSCULAR | Status: DC | PRN
  Administered 2013-04-13 (×6): 50 ug via INTRAVENOUS

## 2013-04-13 MED ORDER — LEVOTHYROXINE SODIUM 112 MCG PO TABS
112.0000 ug | ORAL_TABLET | Freq: Every day | ORAL | Status: DC
  Administered 2013-04-14: 112 ug via ORAL
  Filled 2013-04-13 (×2): qty 1

## 2013-04-13 MED ORDER — OXYCODONE HCL 5 MG/5ML PO SOLN: 5.0000 mg | Freq: Once | ORAL | Status: AC | PRN

## 2013-04-13 MED ORDER — HYDROMORPHONE HCL PF 1 MG/ML IJ SOLN
INTRAMUSCULAR | Status: AC
  Filled 2013-04-13: qty 1

## 2013-04-13 MED ORDER — HYDROCODONE-ACETAMINOPHEN 5-325 MG PO TABS
1.0000 | ORAL_TABLET | ORAL | Status: DC | PRN
  Administered 2013-04-13: 2 via ORAL
  Administered 2013-04-14 (×2): 1 via ORAL
  Filled 2013-04-13 (×2): qty 1
  Filled 2013-04-13: qty 2

## 2013-04-13 MED ORDER — CIPROFLOXACIN IN D5W 400 MG/200ML IV SOLN
400.0000 mg | Freq: Two times a day (BID) | INTRAVENOUS | Status: DC
  Administered 2013-04-13 – 2013-04-14 (×3): 400 mg via INTRAVENOUS
  Filled 2013-04-13 (×5): qty 200

## 2013-04-13 MED ORDER — PROPOFOL 10 MG/ML IV BOLUS
INTRAVENOUS | Status: DC | PRN
  Administered 2013-04-13: 150 mg via INTRAVENOUS

## 2013-04-13 MED ORDER — ZOLPIDEM TARTRATE 5 MG PO TABS
5.0000 mg | ORAL_TABLET | Freq: Every evening | ORAL | Status: DC | PRN
  Administered 2013-04-13: 5 mg via ORAL
  Filled 2013-04-13: qty 1

## 2013-04-13 MED ORDER — TECHNETIUM TC 99M SULFUR COLLOID FILTERED
1.0000 | Freq: Once | INTRAVENOUS | Status: AC | PRN
  Administered 2013-04-13: 1 via INTRADERMAL

## 2013-04-13 MED ORDER — ALBUMIN HUMAN 5 % IV SOLN
INTRAVENOUS | Status: DC | PRN
  Administered 2013-04-13: 09:00:00 via INTRAVENOUS

## 2013-04-13 MED ORDER — OXYCODONE HCL 5 MG PO TABS
5.0000 mg | ORAL_TABLET | Freq: Once | ORAL | Status: AC | PRN
  Administered 2013-04-13: 5 mg via ORAL

## 2013-04-13 MED ORDER — NEOSTIGMINE METHYLSULFATE 1 MG/ML IJ SOLN
INTRAMUSCULAR | Status: DC | PRN
  Administered 2013-04-13: 3 mg via INTRAVENOUS

## 2013-04-13 MED ORDER — ONDANSETRON HCL 4 MG/2ML IJ SOLN: INTRAMUSCULAR | Status: DC | PRN

## 2013-04-13 MED ORDER — ALPRAZOLAM 0.5 MG PO TABS
0.5000 mg | ORAL_TABLET | ORAL | Status: DC | PRN
  Administered 2013-04-13: 0.5 mg via ORAL
  Filled 2013-04-13: qty 1

## 2013-04-13 MED ORDER — GLYCOPYRROLATE 0.2 MG/ML IJ SOLN
INTRAMUSCULAR | Status: DC | PRN
  Administered 2013-04-13: 0.4 mg via INTRAVENOUS

## 2013-04-13 MED ORDER — DIAZEPAM 2 MG PO TABS
2.0000 mg | ORAL_TABLET | Freq: Two times a day (BID) | ORAL | Status: DC | PRN
  Administered 2013-04-14: 2 mg via ORAL
  Filled 2013-04-13: qty 1

## 2013-04-13 MED ORDER — ROCURONIUM BROMIDE 100 MG/10ML IV SOLN
INTRAVENOUS | Status: DC | PRN
  Administered 2013-04-13: 50 mg via INTRAVENOUS

## 2013-04-13 MED ORDER — MIDAZOLAM HCL 5 MG/5ML IJ SOLN
INTRAMUSCULAR | Status: DC | PRN
  Administered 2013-04-13: 2 mg via INTRAVENOUS

## 2013-04-13 MED ORDER — HYDROMORPHONE HCL PF 1 MG/ML IJ SOLN
INTRAMUSCULAR | Status: AC
  Administered 2013-04-13: 0.5 mg via INTRAVENOUS
  Filled 2013-04-13: qty 1

## 2013-04-13 MED ORDER — METHYLENE BLUE 1 % INJ SOLN
INTRAMUSCULAR | Status: AC
  Filled 2013-04-13: qty 10

## 2013-04-13 MED ORDER — MORPHINE SULFATE 2 MG/ML IJ SOLN
2.0000 mg | INTRAMUSCULAR | Status: DC | PRN
  Administered 2013-04-13 – 2013-04-14 (×4): 2 mg via INTRAVENOUS
  Filled 2013-04-13 (×4): qty 1

## 2013-04-13 MED ORDER — DOCUSATE SODIUM 100 MG PO CAPS
100.0000 mg | ORAL_CAPSULE | Freq: Three times a day (TID) | ORAL | Status: DC
  Administered 2013-04-13 – 2013-04-14 (×3): 100 mg via ORAL
  Filled 2013-04-13 (×3): qty 1

## 2013-04-13 SURGICAL SUPPLY — 69 items
APPLIER CLIP 9.375 MED OPEN (MISCELLANEOUS)
BAG DECANTER FOR FLEXI CONT (MISCELLANEOUS) ×2 IMPLANT
BENZOIN TINCTURE PRP APPL 2/3 (GAUZE/BANDAGES/DRESSINGS) ×2 IMPLANT
BINDER BREAST LRG (GAUZE/BANDAGES/DRESSINGS) IMPLANT
BINDER BREAST MEDIUM (GAUZE/BANDAGES/DRESSINGS) ×2 IMPLANT
BINDER BREAST XLRG (GAUZE/BANDAGES/DRESSINGS) IMPLANT
BIOPATCH RED 1 DISK 7.0 (GAUZE/BANDAGES/DRESSINGS) ×2 IMPLANT
CANISTER SUCTION 2500CC (MISCELLANEOUS) ×4 IMPLANT
CHLORAPREP W/TINT 26ML (MISCELLANEOUS) ×4 IMPLANT
CLIP APPLIE 9.375 MED OPEN (MISCELLANEOUS) IMPLANT
CLOTH BEACON ORANGE TIMEOUT ST (SAFETY) ×4 IMPLANT
CONT SPEC 4OZ CLIKSEAL STRL BL (MISCELLANEOUS) ×2 IMPLANT
COVER PROBE W GEL 5X96 (DRAPES) ×2 IMPLANT
COVER SURGICAL LIGHT HANDLE (MISCELLANEOUS) ×4 IMPLANT
DERMABOND ADVANCED (GAUZE/BANDAGES/DRESSINGS) ×1
DERMABOND ADVANCED .7 DNX12 (GAUZE/BANDAGES/DRESSINGS) ×1 IMPLANT
DRAIN CHANNEL 19F RND (DRAIN) ×4 IMPLANT
DRAPE LAPAROSCOPIC ABDOMINAL (DRAPES) ×2 IMPLANT
DRAPE ORTHO SPLIT 77X108 STRL (DRAPES) ×2
DRAPE PROXIMA HALF (DRAPES) ×6 IMPLANT
DRAPE SURG 17X23 STRL (DRAPES) ×8 IMPLANT
DRAPE SURG ORHT 6 SPLT 77X108 (DRAPES) ×2 IMPLANT
DRAPE WARM FLUID 44X44 (DRAPE) ×2 IMPLANT
DRSG PAD ABDOMINAL 8X10 ST (GAUZE/BANDAGES/DRESSINGS) ×2 IMPLANT
ELECT BLADE 4.0 EZ CLEAN MEGAD (MISCELLANEOUS) ×4
ELECT CAUTERY BLADE 6.4 (BLADE) ×2 IMPLANT
ELECT REM PT RETURN 9FT ADLT (ELECTROSURGICAL) ×4
ELECTRODE BLDE 4.0 EZ CLN MEGD (MISCELLANEOUS) ×2 IMPLANT
ELECTRODE REM PT RTRN 9FT ADLT (ELECTROSURGICAL) ×2 IMPLANT
EVACUATOR SILICONE 100CC (DRAIN) ×4 IMPLANT
GLOVE BIO SURGEON STRL SZ 6.5 (GLOVE) ×2 IMPLANT
GLOVE BIO SURGEON STRL SZ7 (GLOVE) ×2 IMPLANT
GLOVE BIOGEL PI IND STRL 7.5 (GLOVE) ×1 IMPLANT
GLOVE BIOGEL PI INDICATOR 7.5 (GLOVE) ×1
GOWN STRL NON-REIN LRG LVL3 (GOWN DISPOSABLE) ×10 IMPLANT
GRAFT FLEX HD 4X16 THICK (Tissue Mesh) ×2 IMPLANT
KIT BASIN OR (CUSTOM PROCEDURE TRAY) ×4 IMPLANT
KIT ROOM TURNOVER OR (KITS) ×4 IMPLANT
MARKER SKIN DUAL TIP RULER LAB (MISCELLANEOUS) ×2 IMPLANT
NEEDLE 18GX1X1/2 (RX/OR ONLY) (NEEDLE) IMPLANT
NEEDLE 21 GA WING INFUSION (NEEDLE) ×2 IMPLANT
NEEDLE HYPO 25GX1X1/2 BEV (NEEDLE) ×2 IMPLANT
NS IRRIG 1000ML POUR BTL (IV SOLUTION) ×6 IMPLANT
PACK GENERAL/GYN (CUSTOM PROCEDURE TRAY) ×4 IMPLANT
PAD ARMBOARD 7.5X6 YLW CONV (MISCELLANEOUS) ×4 IMPLANT
PIN SAFETY STERILE (MISCELLANEOUS) ×4 IMPLANT
SET ASEPTIC TRANSFER (MISCELLANEOUS) IMPLANT
SPECIMEN JAR X LARGE (MISCELLANEOUS) ×2 IMPLANT
SPONGE GAUZE 4X4 12PLY (GAUZE/BANDAGES/DRESSINGS) IMPLANT
STAPLER VISISTAT 35W (STAPLE) ×2 IMPLANT
STRIP CLOSURE SKIN 1/2X4 (GAUZE/BANDAGES/DRESSINGS) ×2 IMPLANT
SUT ETHILON 2 0 FS 18 (SUTURE) ×2 IMPLANT
SUT MON AB 4-0 PC3 18 (SUTURE) ×2 IMPLANT
SUT MON AB 5-0 PS2 18 (SUTURE) ×4 IMPLANT
SUT PDS AB 2-0 CT1 27 (SUTURE) ×4 IMPLANT
SUT SILK 2 0 SH (SUTURE) IMPLANT
SUT SILK 3 0 SH 30 (SUTURE) ×2 IMPLANT
SUT VIC AB 3-0 54X BRD REEL (SUTURE) ×1 IMPLANT
SUT VIC AB 3-0 BRD 54 (SUTURE) ×1
SUT VIC AB 3-0 SH 27 (SUTURE) ×2
SUT VIC AB 3-0 SH 27X BRD (SUTURE) ×2 IMPLANT
SUT VIC AB 3-0 SH 8-18 (SUTURE) ×2 IMPLANT
SUT VICRYL 4-0 PS2 18IN ABS (SUTURE) ×2 IMPLANT
SYR CONTROL 10ML LL (SYRINGE) IMPLANT
TOWEL OR 17X24 6PK STRL BLUE (TOWEL DISPOSABLE) ×4 IMPLANT
TOWEL OR 17X26 10 PK STRL BLUE (TOWEL DISPOSABLE) ×4 IMPLANT
TRAY FOLEY CATH 14FRSI W/METER (CATHETERS) IMPLANT
WATER STERILE IRR 1000ML POUR (IV SOLUTION) IMPLANT
mentor cpx4 with suture tabs medium height breast ×2 IMPLANT

## 2013-04-13 NOTE — Interval H&P Note (Signed)
History and Physical Interval Note:  04/13/2013 8:34 AM  Amanda Coleman  has presented today for surgery, with the diagnosis of left breast cancer  The various methods of treatment have been discussed with the patient and family. After consideration of risks, benefits and other options for treatment, the patient has consented to  Procedure(s): LEFT SKIN SPARING MASTECTOMY WITH LEFT AXILLARY SENTINEL NODE BIOPSY (Left) LEFT BREAST RECONSTRUCTION WITH PLACEMENT OF TISSUE EXPANDER AND FLEX HD (ACELLULAR HYDRATED DERMIS) (Left) as a surgical intervention .  The patient's history has been reviewed, patient examined, no change in status, stable for surgery.  I have reviewed the patient's chart and labs.  Questions were answered to the patient's satisfaction.     SANGER,[name]

## 2013-04-13 NOTE — Brief Op Note (Signed)
04/13/2013  8:29 AM  PATIENT:  Marylu Lund Hillyard  47 y.o. female  PRE-OPERATIVE DIAGNOSIS:  left breast cancer  POST-OPERATIVE DIAGNOSIS:  Left breast cancer  PROCEDURE:  Procedure(s): LEFT SKIN SPARING MASTECTOMY WITH LEFT AXILLARY SENTINEL NODE BIOPSY (Left) LEFT BREAST RECONSTRUCTION WITH PLACEMENT OF TISSUE EXPANDER AND FLEX HD (ACELLULAR HYDRATED DERMIS) (Left)  SURGEON:  Surgeon(s) and Role: Panel 1:    * Emelia Loron, MD - Primary  Panel 2:    * Misk Galentine Sanger, DO - Primary  PHYSICIAN ASSISTANT: Shawn Rayburn, PA  ASSISTANTS: none   ANESTHESIA:   general  EBL: less than 200 cc total    BLOOD ADMINISTERED:none  DRAINS: (one) Jackson-Pratt drain(s) with closed bulb suction in the left breast pocket   LOCAL MEDICATIONS USED:  NONE  SPECIMEN:  No Specimen  DISPOSITION OF SPECIMEN:  N/A  COUNTS:  YES  TOURNIQUET:  * No tourniquets in log *  DICTATION: .Dragon Dictation  PLAN OF CARE: Discharge to home after PACU  PATIENT DISPOSITION:  Admit   Delay start of Pharmacological VTE agent (>24hrs) due to surgical blood loss or risk of bleeding: no

## 2013-04-13 NOTE — Preoperative (Addendum)
Beta Blockers   Reason not to administer Beta Blockers:Not Applicable 

## 2013-04-13 NOTE — Anesthesia Postprocedure Evaluation (Signed)
Anesthesia Post Note  Patient: Amanda Coleman  Procedure(s) Performed: Procedure(s) (LRB): LEFT SKIN SPARING MASTECTOMY WITH LEFT AXILLARY SENTINEL NODE BIOPSY (Left) LEFT BREAST RECONSTRUCTION WITH PLACEMENT OF TISSUE EXPANDER AND FLEX HD (ACELLULAR HYDRATED DERMIS) (Left)  Anesthesia type: General  Patient location: PACU  Post pain: Pain level controlled and Adequate analgesia  Post assessment: Post-op Vital signs reviewed, Patient's Cardiovascular Status Stable, Respiratory Function Stable, Patent Airway and Pain level controlled  Last Vitals:  Filed Vitals:   04/13/13 1133  BP: 127/83  Pulse: 64  Temp:   Resp: 18    Post vital signs: Reviewed and stable  Level of consciousness: awake, alert  and oriented  Complications: No apparent anesthesia complications

## 2013-04-13 NOTE — Op Note (Signed)
Op report   SURGICAL DIVISION: Plastic Surgery  PREOPERATIVE DIAGNOSES:  1. Left breast cancer.   POSTOPERATIVE DIAGNOSES:  1. Left breast cancer.   PROCEDURE:  1. Immediate left breast reconstruction with placement of tissue expander (Mentor medium height 100/275cc) and FlexHD 4 x 16 .  SURGEON: Wayland Denis, DO  ASSISTANT: Shawn Rayburn, PA  ANESTHESIA:  General.   COMPLICATIONS: None.   IMPLANTS: Left - Mentor medium height 275 cc with 100 cc of injectable normal saline placed.  INDICATIONS FOR PROCEDURE:  The patient has left breast cancer.   CONSENT:  Informed consent was obtained directly from the patient. Risks, benefits and alternatives were fully discussed. Specific risks including but not limited to bleeding, infection, hematoma, seroma, scarring, pain, implant infection, implant extrusion, capsular contracture, asymmetry, wound healing problems, and need for further surgery were all discussed. The patient did have an ample opportunity to have her questions answered to her satisfaction.   DESCRIPTION OF PROCEDURE:  The patient was taken to the operating room. SCDs were placed and IV antibiotics were given. The patient's chest was prepped and draped in a sterile fashion. A time out was performed.  General surgery performed their portion of the [place] which include skin sparing left mastectomy and SLN dissection. The pectoralis muscle was lifted using the bovie.   Hemostasis was ensured. The pocket was irrigated with antibiotic solution.  The Flex HD was placed after being prepared according to the manufacture guidelines. Slits were placed to help with drainage.  The FlexHD was secured to the lateral inferior edge of the pectoralis muscle and inframammary fold with 2-0 PDS after placing the expander.  The expander was secured to the chest wall with 2-0 PDS at the inferior and lateral edge using the tabs.  The drain was placed and secured to the chest wall with 3-0 Silk.   Injectable saline was placed in the expander for a total of 100 cc after the air was evacuated.  The deep layers were closed with 3-0 Vicryl followed by 4-0 Vicryl and the skin was closed with 5-0 Monocryl. Dermabond was applied with a sterile dressing.  A breast binder and ABD were placed. The patient was allowed to wake up from anesthesia and taken to the recovery room in satisfactory condition.

## 2013-04-13 NOTE — Brief Op Note (Signed)
04/13/2013  10:00 AM  PATIENT:  Amanda Coleman  47 y.o. female  PRE-OPERATIVE DIAGNOSIS:  left breast cancer  POST-OPERATIVE DIAGNOSIS:  Left breast cancer  PROCEDURE:  Procedure(s): LEFT SKIN SPARING MASTECTOMY WITH LEFT AXILLARY SENTINEL NODE BIOPSY (Left) LEFT BREAST RECONSTRUCTION WITH PLACEMENT OF TISSUE EXPANDER AND FLEX HD (ACELLULAR HYDRATED DERMIS) (Left)  SURGEON:  Surgeon(s) and Role: Panel 1:    * Emelia Loron, MD - Primary  Panel 2:    * Tinika Bucknam Sanger, DO - Primary  PHYSICIAN ASSISTANT: Shawn Rayburn, PA  ASSISTANTS: none   ANESTHESIA:   general  EBL:  Total I/O In: 1450 [I.V.:1200; IV Piggyback:250] Out: -   BLOOD ADMINISTERED:none  DRAINS: (one) Jackson-Pratt drain(s) with closed bulb suction in the left breast   LOCAL MEDICATIONS USED:  NONE  SPECIMEN:  No Specimen  DISPOSITION OF SPECIMEN:  N/A  COUNTS:  YES  TOURNIQUET:  * No tourniquets in log *  DICTATION: .Dragon Dictation  PLAN OF CARE: Discharge to home after PACU  PATIENT DISPOSITION:  admit   Delay start of Pharmacological VTE agent (>24hrs) due to surgical blood loss or risk of bleeding: no

## 2013-04-13 NOTE — Anesthesia Preprocedure Evaluation (Addendum)
Anesthesia Evaluation  Patient identified by MRN, date of birth, ID band Patient awake    Reviewed: Allergy & Precautions, H&P , NPO status , Patient's Chart, lab work & pertinent test results  History of Anesthesia Complications Negative for: history of anesthetic complications  Airway Mallampati: II TM Distance: >3 FB Neck ROM: full    Dental  (+) Teeth Intact and Dental Advisory Given   Pulmonary neg pulmonary ROS,  breath sounds clear to auscultation        Cardiovascular negative cardio ROS  Rhythm:Regular Rate:Normal     Neuro/Psych negative neurological ROS  negative psych ROS   GI/Hepatic negative GI ROS, Neg liver ROS,   Endo/Other  negative endocrine ROSHypothyroidism   Renal/GU negative Renal ROS     Musculoskeletal negative musculoskeletal ROS (+)   Abdominal   Peds  Hematology negative hematology ROS (+)   Anesthesia Other Findings   Reproductive/Obstetrics negative OB ROS Breast CA Hysterectomy.                          Anesthesia Physical Anesthesia Plan  ASA: II  Anesthesia Plan: General   Post-op Pain Management:    Induction: Intravenous  Airway Management Planned: Oral ETT  Additional Equipment:   Intra-op Plan:   Post-operative Plan: Extubation in OR  Informed Consent: I have reviewed the patients History and Physical, chart, labs and discussed the procedure including the risks, benefits and alternatives for the proposed anesthesia with the patient or authorized representative who has indicated his/her understanding and acceptance.     Plan Discussed with: CRNA, Anesthesiologist and Surgeon  Anesthesia Plan Comments:         Anesthesia Quick Evaluation

## 2013-04-13 NOTE — Transfer of Care (Signed)
Immediate Anesthesia Transfer of Care Note  Patient: Amanda Coleman  Procedure(s) Performed: Procedure(s): LEFT SKIN SPARING MASTECTOMY WITH LEFT AXILLARY SENTINEL NODE BIOPSY (Left) LEFT BREAST RECONSTRUCTION WITH PLACEMENT OF TISSUE EXPANDER AND FLEX HD (ACELLULAR HYDRATED DERMIS) (Left)  Patient Location: PACU  Anesthesia Type:General  Level of Consciousness: awake, oriented and patient cooperative  Airway & Oxygen Therapy: Patient Spontanous Breathing and Patient connected to nasal cannula oxygen  Post-op Assessment: Report given to PACU RN and Post -op Vital signs reviewed and stable  Post vital signs: Reviewed and stable  Complications: No apparent anesthesia complications

## 2013-04-13 NOTE — H&P (View-Only) (Signed)
Patient ID: Amanda Coleman, female   DOB: 09/11/1965, 47 y.o.   MRN: [id number]  Chief Complaint  Patient presents with  . breast cancer    HPI Amanda Coleman is a 47 y.o. female.  Referred by Dr Ron [name] HPI 47 yof who was scheduling routine annual exam with Dr [name] and told his nurse she had been having some bloody left nipple discharge.  She was seen quickly and referred for mm.  Her mm does not really show an abnormality as her breasts are extremely dense.  An u/s was performed in area of a palpable mass that showed at least a 3.4x0.9x1.7 cm mass.  MRI shows 5 x3.3x2.6 cm area of asymmetric enhancement but no real mass.  There are no abnormal nodes or right breast abnormalities.  Biopsy was performed that shows grade I invasive ductal carcinoma that is er/pr positive, her 2 negative and Ki is 11%.  She comes in today to discuss her options.  Past Medical History  Diagnosis Date  . Thyroid disease     Past Surgical History  Procedure Laterality Date  . Abdominal hysterectomy    . Gum graft      Family History  Problem Relation Age of Onset  . Pancreatic cancer Father   . Lung cancer Maternal Aunt   . Uterine cancer Maternal Grandmother   . Ovarian cancer Maternal Grandmother     Social History History  Substance Use Topics  . Smoking status: Never Smoker   . Smokeless tobacco: Not on file  . Alcohol Use: No    Allergies  Allergen Reactions  . Penicillins Swelling  . Sulfa Antibiotics Rash    Current Outpatient Prescriptions  Medication Sig Dispense Refill  . ALPRAZolam (XANAX) 0.5 MG tablet Take 0.5 mg by mouth as needed for sleep.      . clindamycin (CLEOCIN) 150 MG capsule       . Levothyroxine Sodium 112 MCG CAPS Take 1 each by mouth daily before breakfast.      . mupirocin ointment (BACTROBAN) 2 %       . zolpidem (AMBIEN) 5 MG tablet Take 5 mg by mouth at bedtime as needed for sleep.       No current facility-administered medications for this visit.    Review of  Systems Review of Systems  Constitutional: Negative for fever, chills and unexpected weight change.  HENT: Positive for sinus pressure. Negative for hearing loss, congestion, sore throat, trouble swallowing and voice change.   Eyes: Negative for visual disturbance.  Respiratory: Negative for cough and wheezing.   Cardiovascular: Negative for chest pain, palpitations and leg swelling.  Gastrointestinal: Negative for nausea, vomiting, abdominal pain, diarrhea, constipation, blood in stool, abdominal distention and anal bleeding.  Genitourinary: Negative for hematuria, vaginal bleeding and difficulty urinating.  Musculoskeletal: Positive for arthralgias.  Skin: Negative for rash and wound.  Neurological: Negative for seizures, syncope and headaches.  Hematological: Negative for adenopathy. Does not bruise/bleed easily.  Psychiatric/Behavioral: Negative for confusion.    There were no vitals taken for this visit.  Physical Exam Physical Exam  Vitals reviewed. Constitutional: She appears well-developed and well-nourished.  Neck: Neck supple.  Cardiovascular: Normal rate, regular rhythm and normal heart sounds.   Pulmonary/Chest: Effort normal and breath sounds normal. She has no wheezes. She has no rales. Right breast exhibits no inverted nipple, no mass, no nipple discharge, no skin change and no tenderness. Left breast exhibits mass. Left breast exhibits no inverted nipple, no nipple discharge, no   skin change and no tenderness.    Abdominal: Soft.  Lymphadenopathy:    She has no cervical adenopathy.    Data Reviewed BILATERAL BREAST MRI WITH AND WITHOUT CONTRAST  Technique: Multiplanar, multisequence MR images of both breasts  were obtained prior to and following the intravenous administration  of 13ml of Multihance.  THREE-DIMENSIONAL MR IMAGE RENDERING ON INDEPENDENT WORKSTATION:  Three-dimensional MR images were rendered by post-processing of  the original MR data on an  independent DynaCad workstation. The  three-dimensional MR images were interpreted, and findings are  reported in the following complete MRI report for this study.  Comparison: Recent imaging examinations.  FINDINGS:  Breast composition: d: The breast tissue is extremely dense,  which lowers the sensitivity of mammography.  Background parenchymal enhancement: Marked  Right breast: No mass or abnormal enhancement.  Left breast: There is a region of irregular asymmetric enhancement  with internal predominant plateau type enhancement kinetics in the  left breast six o'clock location with central clip artifact,  corresponding to the biopsy-proven breast cancer. The area of  asymmetrically prominent mass-like enhancement is larger than the  area seen on ultrasound and measures 5.0 x 3.3 x 2.6 cm.  Lymph nodes: No abnormal appearing lymph nodes.  Ancillary findings: None.  IMPRESSION:  5.0 cm area of abnormally asymmetric enhancement with internal clip  artifact in the left breast six o'clock location corresponding to  biopsy-proven breast cancer; the area of enhancement suggests the  area of involvement is larger than that apparent at ultrasound and  mammography, which may in part be due to the patient's dense breast  parenchyma. No other area of abnormal enhancement in either  breast, allowing for marked background enhancement in extremely  dense breast parenchymal pattern.   Assessment    Left breast cancer    Plan    Left skin sparing mastectomy, left axillary sentinel node biopsy    I think due to size of mass and breast size (as well as location in lower inner quadrant) it will be difficult to convert her to a lumpectomy. Her tumor is larger but biologically not very aggressive making less likely to shrink with chemotherapy and I think antiestrogen therapy would be unlikely to convert.  I discussed a mastectomy with her today.  I don't think a nipple sparing mastectomy is good  option for her tumor due to her exam and mr.  I do think a skin sparing mastectomy combined with immediate reconstruction would be reasonable. I am also concerned the inability of mm to pick this up and her breast density.  She could be followed with mm and mr though. I will have her see plastic surgery this week for evaluation.  We also discussed role of bilateral mastectomies.  I told her there are some reasons including symmetry and detection but there is probably not a survival advantage (barring a genetic abnormality) for this.  We have decided to try to do genetic testing then proceed likely with above procedure combined with plastic surgery. We discussed the staging and pathophysiology of breast cancer. We discussed all of the different options for treatment for breast cancer including surgery, chemotherapy, radiation therapy, Herceptin, and antiestrogen therapy.   We discussed a sentinel lymph node biopsy as she does not appear to having lymph node involvement right now. We discussed the performance of that with injection of radioactive tracer and blue dye. We discussed that she would have an incision underneath her axillary hairline. We discussed that there is a   bout a 10-20% chance of having a positive node with a sentinel lymph node biopsy and we will await the permanent pathology to make any other first further decisions in terms of her treatment. One of these options might be to return to the operating room to perform an axillary lymph node dissection. We discussed about a 1-2% risk lifetime of chronic shoulder pain as well as lymphedema associated with a sentinel lymph node biopsy.  We discussed that mastectomy does not reduce local recurrence rate to 0. We discussed the risks of operation including bleeding, infection, possible reoperation. She understands her further therapy will be based on what her stages at the time of her operation.     Amanda Coleman 03/29/2013, 2:41 PM    

## 2013-04-13 NOTE — H&P (View-Only) (Signed)
This document contains confidential information from a [facility] medical record system and may be unauthenticated. Release may be made only with a valid authorization or in accordance with applicable policies of Medical Center or its affiliates. This document must be maintained in a secure manner or discarded/destroyed as required by Medical Center policy or by a confidential means such as shredding.     Amanda Coleman  03/31/2013 10:30 AM   Office Visit  MRN:  [id number]  Provider: Wayland Denis, DO  Department:  Plastic Surgery  Dept Phone: [phone]   Diagnoses    Breast cancer, left (HCC)    -  Primary   174.9      Reason for Visit -  Breast Reconstruction     Vitals - Last Recorded      138/77  81  1.651 m (5\' 5" )  62.143 kg (137 lb)  22.8 kg/m2       Subjective:    Patient ID: Amanda Coleman is a 47 y.o. female.  HPI The patient is a 47 yrs old female here for evaluation for breast reconstruction.  She was having bloody nipple drainage from her left breast and was due for a mammagram.  She palpated a mass but it did no show on the mammagram.  The ultrasound showed a 3.4 x 0.9 x 1.7 cm mass and the MRI showed a 5 x 3.3 x 2.6 cm area of asymmetric enhancement but no real mass. No abnormal nodes or right breast anomalies were noted.  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 She has thyroid disease and has undergone an abdominal hysterectomy and gum graft.  She is not a smoker.  She is 5 feet 5 inches tall, weighs 137 pounds and wears a 34B bra. She would like to be around the same size.  She does not have a family history of breast cancer and is not going to have genetic testing.  The following portions of the patient's history were reviewed and updated as appropriate: allergies, current medications, past family history, past medical history, past social history, past surgical history and problem list.  Review of Systems   Constitutional: Negative.   HENT: Negative.   Eyes: Negative.   Respiratory: Negative.   Cardiovascular: Negative.   Gastrointestinal: Negative.   Endocrine: Negative.   Genitourinary: Negative.   Neurological: Negative.   Hematological: Negative.   Psychiatric/Behavioral: Negative.         Objective:    Physical Exam  Constitutional: She appears well-developed and well-nourished.  HENT:   Head: Normocephalic and atraumatic.  Eyes: EOM are normal. Pupils are equal, round, and reactive to light.  Cardiovascular: Normal rate.   Pulmonary/Chest: Effort normal.  Abdominal: Soft.  Musculoskeletal: Normal range of motion.  Neurological: She is alert.  Skin: Skin is warm.  Psychiatric: She has a normal mood and affect. Her behavior is normal. Judgment and thought content normal.   Assessment:   1.  Breast cancer, left (HCC)        Plan:     Assessment and Plan:   A long, detailed conversation was had regarding the patient's options for breast reconstruction. Five main points, which are explained to all breast reconstruction patients, were discussed.   1. Breast reconstruction is an optional process.   2. Breast reconstruction is a multi-stage process which involves multiple surgeries spaced several months apart. The entire process can take over one year.  3. The major goal of breast reconstruction is to have the patient look normal in clothing. When naked, there will always be scars.   4. Asymmetries are often present during the reconstruction process. Several operations may be needed, including surgery to the non-cancerous breast, to achieve satisfactory results.   5. No matter the reconstructive method, there are ways that the reconstruction can fail and a secondary reconstructive plan would need to be created.   A general discussion regarding all available methods of breast reconstruction were discussed. The types of reconstructions described included.   1. Tissue expander and  implant based reconstruction, both single and multi-stage approaches.   2. Autologous only reconstructions, including free abdominal-tissue based reconstructions.   3. Combination procedures, particularly latissismus dorsi flaps combined with either expanders or implants.   For each of the reconstruction methods mentioned above, the risks, benefits, alternatives, scarring, and recovery time were discussed in great detail. Specific risks detailed included bleeding, infection, hematoma, seroma, scarring, pain, wound healing complications, flap loss, fat necrosis, capsular contracture, need for implant removal, donor site complications, bulge, hernia, umbilical necrosis, need for urgent reoperation, and need for dressing changes were discussed.    Assessment   Once all reconstruction options were presented, a focused discussion was had regarding the patient's suitability for each of these procedures.   A total of 50 minutes of face-to-face time was spent in this encounter, of which >50% was spent in counseling. She is a good candidate for left expander/Flex HD placement.

## 2013-04-13 NOTE — Op Note (Signed)
Preoperative diagnosis: Left breast cancer, clinical stage II Postoperative diagnosis: same as above Procedure: 1. Left total mastectomy 2. Left axillary sentinel node biopsy Surgeon: Dr Harden Mo Anesthesia: general EBL: minimal Specimen:  1. Left breast including nipple and areola, short stitch superior, long stitch lateral 2. Two left axillary sentinel nodes with counts of 139, 599 Drains: none from me Complications: none Sponge and needle count correct Disposition Bradburn turned over to Dr Kelly Splinter for reconstruction  Indications: This is a 89 yof who presented with bloody left nipple discharge.  She had normal mm due to breast density.  She had ultrasound of a palpable mass and MR that showed larger lower inner quadrant right breast mass that was biopsied showing invasive ductal carcinoma.  We discussed all her options at the multidisciplinary clinic and have decided to proceed with left mastectomy (including nipple and areola) with sentinel node biopsy.  She will also undergo immediate expander reconstruction.    Procedure: After informed consent was obtained, the patient first was injected with technetium in the standard periareolar fashion.  She was given 1 gram on vancomycin.  SCDs were in place.  She was then placed under general anesthesia. Her left breast and axilla were then prepped and draped in the standard sterile surgical fashion. A surgical timeout was performed.  I made an elliptical incision that was based in the lower inner quadrant and included the nipple and areolar complex due to the fact that she had bloody discharge. I then created flaps to the inframammary crease, sternum, clavicle, and latissimus laterally. The breast was then removed from the pectoralis muscle to include the pectoralis fascia. This was marked as above. This was passed off the table as a specimen. Grossly my margins were very good.  I then used the neoprobe to identify 2 sentinel lymph nodes. The  counts are listed as above. I placed several clips on some small vessels in the axilla. Hemostasis was then obtained. There was no real background radioactivity. I closed the axilla with 2-0 Vicryl.  I then irrigated. I obtained hemostasis throughout the area of surgery. I then turned the Lesh over to plastic surgery.

## 2013-04-13 NOTE — Anesthesia Procedure Notes (Signed)
Procedure Name: Intubation Date/Time: 04/13/2013 8:07 AM Performed by: Lovie Chol Pre-anesthesia Checklist: Patient identified, Emergency Drugs available, Suction available, Patient being monitored and Timeout performed Patient Re-evaluated:Patient Re-evaluated prior to inductionOxygen Delivery Method: Circle system utilized Preoxygenation: Pre-oxygenation with 100% oxygen Intubation Type: IV induction Ventilation: Mask ventilation without difficulty Laryngoscope Size: Miller and 2 Grade View: Grade I Tube type: Oral Tube size: 7.0 mm Number of attempts: 1 Airway Equipment and Method: Lighted stylet Placement Confirmation: ETT inserted through vocal cords under direct vision,  positive ETCO2,  CO2 detector and breath sounds checked- equal and bilateral Secured at: 21 cm Tube secured with: Tape Dental Injury: Teeth and Oropharynx as per pre-operative assessment  Comments: Airway anesthetized using lidocaine 2% 100mg  via atomizer.

## 2013-04-13 NOTE — Interval H&P Note (Signed)
History and Physical Interval Note:  04/13/2013 7:49 AM  Amanda Coleman  has presented today for surgery, with the diagnosis of left breast cancer  The various methods of treatment have been discussed with the patient and family. After consideration of risks, benefits and other options for treatment, the patient has consented to  Procedure(s): LEFT SKIN SPARING MASTECTOMY WITH LEFT AXILLARY SENTINEL NODE BIOPSY (Left) LEFT BREAST RECONSTRUCTION WITH PLACEMENT OF TISSUE EXPANDER AND FLEX HD (ACELLULAR HYDRATED DERMIS) (Left) as a surgical intervention .  The patient's history has been reviewed, patient examined, no change in status, stable for surgery.  I have reviewed the patient's chart and labs.  Questions were answered to the patient's satisfaction.     Cordie Beazley

## 2013-04-14 MED ORDER — HYDROCODONE-ACETAMINOPHEN 5-325 MG PO TABS: 1.0000 | ORAL_TABLET | ORAL | Status: DC | PRN

## 2013-04-14 NOTE — Discharge Summary (Signed)
Physician Discharge Summary  Patient ID: Amanda Coleman MRN: [id number] DOB/AGE: 45-Feb-1967 45 y.o.  Admit date: 04/13/2013 Discharge date: 04/14/2013  Admission Diagnoses:Breast Cancer  Discharge Diagnoses: Breast Cancer,  Active Problems:   * No active hospital problems. *  Discharged Condition: good  Hospital Course: The patient was taken to the OR for left mastectomy with immediate reconstruction with expander and FlexHd placement.  She was managed on the surgery unit postoperatively.  She did well and was eating, ambulating and pain well tolerated.  Consults: None  Significant Diagnostic Studies: none  Treatments: surgery: left mastectomy with reconstruction  Discharge Exam: Blood pressure 104/48, pulse 71, temperature 98.2 F (36.8 C), temperature source Oral, resp. rate 16, height 5\' 5"  (1.651 m), weight 62.143 kg (137 lb), SpO2 100.00%. General appearance: alert, cooperative and no distress Incision/Wound:  Disposition: Final discharge disposition not confirmed   Future Appointments Provider Department Dept Phone   04/25/2013 10:20 AM Emelia Loron, MD [facility] Surgery, [place] [phone]   05/03/2013 9:30 AM Victorino December, MD [facility] [phone]       Medication List    ASK your doctor about these medications       ALPRAZolam 0.5 MG tablet  Commonly known as:  XANAX  Take 0.5 mg by mouth as needed for sleep.     levothyroxine 112 MCG tablet  Commonly known as:  SYNTHROID, LEVOTHROID  Take 112 mcg by mouth daily before breakfast.     zolpidem 5 MG tablet  Commonly known as:  AMBIEN  Take 5 mg by mouth at bedtime as needed for sleep.           Follow-up Information   Follow up with [facility], DO In 1 week.   Specialty:  Plastic Surgery   Contact information:   [address] [place] [address] [phone]       Follow up with Emelia Loron, MD In 2 weeks.   Specialty:  General Surgery   Contact information:   [address] [place] [address] [phone]       Signed: Wayland Denis 04/14/2013, 2:13 PM

## 2013-04-14 NOTE — Progress Notes (Signed)
1 Day Post-Op  Subjective: In room doing well.  Objective: Vital signs in last 24 hours: Temp:  [98 F (36.7 C)-98.2 F (36.8 C)] 98.2 F (36.8 C) (08/29 0516) Pulse Rate:  [68-104] 71 (08/29 0516) Resp:  [16] 16 (08/29 0516) BP: (96-104)/(45-49) 104/48 mmHg (08/29 0516) SpO2:  [98 %-100 %] 100 % (08/29 0516) Last BM Date: 04/12/13  Intake/Output from previous day: 08/28 0701 - 08/29 0700 In: 2350 [I.V.:1900; IV Piggyback:450] Out: 365 [Drains:365] Intake/Output this shift: Total I/O In: -  Out: 50 [Drains:50]  General appearance: alert, cooperative and no distress Incision/Wound:looking good and intact  Lab Results:  No results found for this basename: WBC, HGB, HCT, PLT,  in the last 72 hours BMET No results found for this basename: NA, K, CL, CO2, GLUCOSE, BUN, CREATININE, CALCIUM,  in the last 72 hours PT/INR No results found for this basename: LABPROT, INR,  in the last 72 hours ABG No results found for this basename: PHART, PCO2, PO2, HCO3,  in the last 72 hours  Studies/Results: Nm Sentinel Node Inj-no Rpt (breast)  04/13/2013   CLINICAL DATA: left axillary sentinel node biopsy   Sulfur colloid was injected intradermally by the nuclear medicine  technologist for breast cancer sentinel node localization.     Anti-infectives: Anti-infectives   Start     Dose/Rate Route Frequency Ordered Stop   04/13/13 1345  ciprofloxacin (CIPRO) IVPB 400 mg     400 mg 200 mL/hr over 60 Minutes Intravenous Every 12 hours 04/13/13 1246     04/13/13 0918  polymyxin B 500,000 Units, bacitracin 50,000 Units in sodium chloride irrigation 0.9 % 500 mL irrigation  Status:  Discontinued       As needed 04/13/13 0920 04/13/13 1017   04/13/13 0600  ciprofloxacin (CIPRO) IVPB 400 mg  Status:  Discontinued     400 mg 200 mL/hr over 60 Minutes Intravenous On call to O.R. 04/12/13 1427 04/13/13 1240   04/13/13 0600  vancomycin (VANCOCIN) IVPB 1000 mg/200 mL premix     1,000 mg 200 mL/hr  over 60 Minutes Intravenous On call to O.R. 04/12/13 1427 04/13/13 0745      Assessment/Plan: s/p Procedure(s): LEFT SKIN SPARING MASTECTOMY WITH LEFT AXILLARY SENTINEL NODE BIOPSY (Left) LEFT BREAST RECONSTRUCTION WITH PLACEMENT OF TISSUE EXPANDER AND FLEX HD (ACELLULAR HYDRATED DERMIS) (Left) Discharge  LOS: 1 day    [facility] 04/14/2013

## 2013-04-14 NOTE — Progress Notes (Signed)
1 Day Post-Op  Subjective: N/v yesterday, feels weak and sore  Objective: Vital signs in last 24 hours: Temp:  [97.5 F (36.4 C)-98.2 F (36.8 C)] 98.2 F (36.8 C) (08/29 0516) Pulse Rate:  [43-104] 71 (08/29 0516) Resp:  [11-23] 16 (08/29 0516) BP: (96-138)/(45-83) 104/48 mmHg (08/29 0516) SpO2:  [98 %-100 %] 100 % (08/29 0516) Weight:  [137 lb (62.143 kg)] 137 lb (62.143 kg) (08/28 1229) Last BM Date: 04/12/13  Intake/Output from previous day: 08/28 0701 - 08/29 0700 In: 2350 [I.V.:1900; IV Piggyback:450] Out: 365 [Drains:365] Intake/Output this shift:    General appearance: no distress Incision/Wound:flaps viable, drains with serosang output  Anti-infectives: Anti-infectives   Start     Dose/Rate Route Frequency Ordered Stop   04/13/13 1345  ciprofloxacin (CIPRO) IVPB 400 mg     400 mg 200 mL/hr over 60 Minutes Intravenous Every 12 hours 04/13/13 1246     04/13/13 0918  polymyxin B 500,000 Units, bacitracin 50,000 Units in sodium chloride irrigation 0.9 % 500 mL irrigation  Status:  Discontinued       As needed 04/13/13 0920 04/13/13 1017   04/13/13 0600  ciprofloxacin (CIPRO) IVPB 400 mg  Status:  Discontinued     400 mg 200 mL/hr over 60 Minutes Intravenous On call to O.R. 04/12/13 1427 04/13/13 1240   04/13/13 0600  vancomycin (VANCOCIN) IVPB 1000 mg/200 mL premix     1,000 mg 200 mL/hr over 60 Minutes Intravenous On call to O.R. 04/12/13 1427 04/13/13 0745      Assessment/Plan: POD 1 left ssm/snbx/expander reconstruction  She may be able to go home later today, drain looks serosang without any hematoma despite high output, will have regular breakfast, up and around and may be able to be dc'd later. i will call with her path next week  [facility] 04/14/2013

## 2013-04-14 NOTE — Progress Notes (Signed)
Discharge instructions gone over with patient. Home medications gone over. Follow up appointments are made. Prescription given to patient. Incisional care, diet, arm precautions and activity gone over. Signs and symptoms of infection and when to call the doctor gone over. Patient demonstrated how to empty and recharge drain bulb. She knows to record drainage amount. Patient verbalized understanding of instructions and was discharged.

## 2013-04-18 ENCOUNTER — Encounter: Payer: Self-pay | Admitting: *Deleted

## 2013-04-18 ENCOUNTER — Encounter (HOSPITAL_COMMUNITY): Payer: Self-pay | Admitting: General Surgery

## 2013-04-18 NOTE — Progress Notes (Signed)
Ordered oncotype 04/18/13.  Faxed requisition to pathology and confirmed receipt.  Faxed PAC to [employer] [facility].

## 2013-04-25 ENCOUNTER — Encounter (INDEPENDENT_AMBULATORY_CARE_PROVIDER_SITE_OTHER): Payer: Self-pay | Admitting: General Surgery

## 2013-04-25 ENCOUNTER — Ambulatory Visit (INDEPENDENT_AMBULATORY_CARE_PROVIDER_SITE_OTHER): Payer: BC Managed Care – PPO | Admitting: General Surgery

## 2013-04-25 VITALS — BP 122/76 | HR 68 | Resp 16 | Ht 65.0 in | Wt 147.4 lb

## 2013-04-25 DIAGNOSIS — C50519 Malignant neoplasm of lower-outer quadrant of unspecified female breast: Secondary | ICD-10-CM

## 2013-04-25 DIAGNOSIS — C50512 Malignant neoplasm of lower-outer quadrant of left female breast: Secondary | ICD-10-CM

## 2013-04-25 NOTE — Progress Notes (Signed)
Subjective:     Patient ID: Amanda Coleman, female   DOB: 1965-12-01, 47 y.o.   MRN: [id number]  HPI 60 yof who underwent left skin sparing mastectomy and sentinel node biopsy with immediate expander placement with Dr Kelly Splinter.  The pathology shows grade II invasive ductal carcinoma measuring 4.2 cm, dcis intermediate grade, dcis broadly less than 1 mm from anterior soft tissue resection margin, 3 sentinel nodes are negative.  This is er/pr positive at 100%, her 2 not amplified and Ki is 11%.  She has done well since surgery and returns today without many complaints.  She is seeing Dr Kelly Splinter today also. Her drain is not holding suction well right now.  Review of Systems     Objective:   Physical Exam    healing left sided incision without infection with some ecchymosis, drain with some old hematoma, mild amount swelling  Assessment:     S/p left ssm for stage II breast cancer     Plan:     I discussed her pathology with her today. We discussed staging and the margin. This is just her skin is left is very thin is a new tumor is going to be close to that. There is no additional surgery certainly that she will need. She has an Oncotype  pending right now. She is due to see Dr. Welton Flakes next week to discuss any further adjuvant therapy. I think she should also see Dr. Basilio Cairo again as well. I will plan on seeing her in her back in about 3 weeks.

## 2013-04-26 ENCOUNTER — Encounter: Payer: Self-pay | Admitting: *Deleted

## 2013-04-26 NOTE — Progress Notes (Signed)
Received notification from [facility] that there is not enough specimen to complete the test.  Took copy to MD and to Med Rec to scan.

## 2013-04-28 ENCOUNTER — Encounter: Payer: Self-pay | Admitting: *Deleted

## 2013-04-28 NOTE — Progress Notes (Signed)
Pathology informed that insufficient tissue for oncotype to be performed.  Per Dr. Colonel Bald block sent with most invasive tissue.  Informed Dr. Welton Flakes of inability to run oncotype.  Will inform pt.

## 2013-05-02 DIAGNOSIS — Z9010 Acquired absence of unspecified breast and nipple: Secondary | ICD-10-CM | POA: Insufficient documentation

## 2013-05-03 ENCOUNTER — Ambulatory Visit (HOSPITAL_BASED_OUTPATIENT_CLINIC_OR_DEPARTMENT_OTHER): Payer: BC Managed Care – PPO | Admitting: Oncology

## 2013-05-03 VITALS — BP 121/71 | HR 73 | Temp 98.5°F | Resp 20 | Ht 65.0 in | Wt 147.1 lb

## 2013-05-03 DIAGNOSIS — C50912 Malignant neoplasm of unspecified site of left female breast: Secondary | ICD-10-CM

## 2013-05-03 DIAGNOSIS — C50919 Malignant neoplasm of unspecified site of unspecified female breast: Secondary | ICD-10-CM

## 2013-05-03 NOTE — Progress Notes (Signed)
OFFICE PROGRESS NOTE  CCFreddy Finner, MD [address] [place] [address] Dr. Lonie Peak  Dr. 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 Patient is seen in the multidisciplinary clinic for discussion of treatment options.   STAGE:  Breast cancer of lower-outer quadrant of left female breast  Primary site: Breast (Left)  Staging method: AJCC 7th Edition  Clinical: Stage IIA (T2, N0, cM0)  Summary: Stage IIA (T2, N0, cM0)   PRIOR THERAPY: #1 patient about 3-4 months experienced a left nipple bloody discharge. She recently had a mammogram that did not show a mass. She had a physical examination which did show a mass measuring 3.4 cm at the 7:00 position. MRI revealed the mass to be 5.0 cm. She went on to have a biopsy performed at the 7:00 position that showed invasive ductal carcinoma grade 1. Prognostic markers revealed the tumor to be estrogen receptor positive progesterone receptor positive HER-2/neu negative with a low Ki-67 11%.  #2patient is now status post mastectomy. The final pathology did reveal invasive ductal carcinoma with ductal carcinoma in situ. Tumor is ER positive. We had attempted to send her tumor for Oncotype testing but there was insufficient tumor and therefore it was not performed. Patient's [place] was discussed again at the multidisciplinary breast conference. It was recommended by the team.because there is significant amount of invasive disease that she should get chemotherapy. I therefore discussed with the patient and her husband role of chemotherapy. I do think she needs chemotherapy.  #3 we discussed types of chemotherapies including Taxotere Cytoxan to be given every 3 weeks for a total of 4 cycles.  CURRENT THERAPY:patient will begin adjuvant chemotherapy  INTERVAL HISTORY: Amanda Coleman 47 y.o. female returns for followup visit after mastectomy.  Postoperatively she is doing well and is without any complaints except for some pain. She's had reconstruction performed by Dr. Wayland Denis. She has no nausea or vomiting no fevers or chills. Patient's [place] was reviewed thoroughly with her today. I have recommended chemotherapy  MEDICAL HISTORY: Past Medical History  Diagnosis Date  . Thyroid disease   . Breast cancer   . Hashimoto's disease   . Hypothyroidism     ALLERGIES:  is allergic to penicillins and sulfa antibiotics.  MEDICATIONS:  Current Outpatient Prescriptions  Medication Sig Dispense Refill  . ALPRAZolam (XANAX) 0.5 MG tablet Take 0.5 mg by mouth as needed for sleep.      . diazepam (VALIUM) 2 MG tablet       . HYDROcodone-acetaminophen (NORCO/VICODIN) 5-325 MG per tablet Take 1-2 tablets by mouth every 4 (four) hours as needed.  40 tablet  0  . levothyroxine (SYNTHROID, LEVOTHROID) 112 MCG tablet Take 112 mcg by mouth daily before breakfast.      . promethazine (PHENERGAN) 12.5 MG tablet       . zolpidem (AMBIEN) 5 MG tablet Take 5 mg by mouth at bedtime as needed for sleep.       No current facility-administered medications for this visit.    SURGICAL HISTORY:  Past Surgical History  Procedure Laterality Date  . Abdominal hysterectomy    . Gum graft    . Simple mastectomy with axillary sentinel node biopsy Left 04/13/2013    Dr Dwain Sarna  . Mastectomy w/ sentinel node biopsy Left 04/13/2013    Procedure: LEFT SKIN SPARING MASTECTOMY WITH LEFT AXILLARY SENTINEL NODE BIOPSY;  Surgeon: Emelia Loron, MD;  Location: [facility] OR;  Service: General;  Laterality: Left;  . Breast reconstruction with placement of tissue expander and flex hd (acellular hydrated dermis) Left 04/13/2013    Procedure: LEFT BREAST RECONSTRUCTION WITH PLACEMENT OF TISSUE EXPANDER AND FLEX HD (ACELLULAR HYDRATED DERMIS);  Surgeon: Wayland Denis, DO;  Location: [facility] OR;  Service: Plastics;  Laterality: Left;    REVIEW OF SYSTEMS:  Pertinent items are  noted in HPI.   HEALTH MAINTENANCE:  PHYSICAL EXAMINATION: Blood pressure 121/71, pulse 73, temperature 98.5 F (36.9 C), temperature source Oral, resp. rate 20, height 5\' 5"  (1.651 m), weight 147 lb 1.6 oz (66.724 kg). Body mass index is 24.48 kg/(m^2). ECOG PERFORMANCE STATUS: 0 - Asymptomatic  Breast exam:left breast is reconstructed healing well   LABORATORY DATA: Lab Results  Component Value Date   WBC 7.9 04/07/2013   HGB 14.0 04/07/2013   HCT 40.4 04/07/2013   MCV 87.3 04/07/2013   PLT 211 04/07/2013      Chemistry      Component Value Date/Time   NA 140 04/07/2013 1058   NA 142 03/29/2013 1225   K 4.0 04/07/2013 1058   K 4.1 03/29/2013 1225   CL 106 04/07/2013 1058   CO2 24 04/07/2013 1058   CO2 23 03/29/2013 1225   BUN 10 04/07/2013 1058   BUN 8.7 03/29/2013 1225   CREATININE 0.66 04/07/2013 1058   CREATININE 0.8 03/29/2013 1225      Component Value Date/Time   CALCIUM 9.2 04/07/2013 1058   CALCIUM 9.5 03/29/2013 1225   ALKPHOS 58 03/29/2013 1225   AST 17 03/29/2013 1225   ALT 14 03/29/2013 1225   BILITOT 0.55 03/29/2013 1225    ADDITIONAL INFORMATION: 1. A sample was sent to [facility] for oncotype testing. However, there was insufficient invasive carcinoma for oncotype testing. (JBK:kh 04/26/13) Pecola Leisure MD Pathologist, Electronic Signature ( Signed 04/26/2013) 1. CHROMOGENIC IN-SITU HYBRIDIZATION Results: HER-2/NEU BY CISH - NO AMPLIFICATION OF HER-2 DETECTED. RESULT RATIO OF HER2: CEP 17 SIGNALS 1.21 AVERAGE HER2 COPY NUMBER PER CELL 1.75 REFERENCE RANGE NEGATIVE HER2/Chr17 Ratio <2.0 and Average HER2 copy number <4.0 EQUIVOCAL HER2/Chr17 Ratio <2.0 and Average HER2 copy number 4.0 and <6.0 POSITIVE HER2/Chr17 Ratio >=2.0 and/or Average HER2 copy number >=6.0 Pecola Leisure MD Pathologist, Electronic Signature ( Signed 04/19/2013) FINAL DIAGNOSIS Diagnosis 1. Breast, simple mastectomy, Left - INVASIVE DUCTAL CARCINOMA, GRADE II/III, SPANNING 4.2 CM. -  DUCTAL CARCINOMA IN SITU, INTERMEDIATE GRADE. 1 of 4 FINAL for Amanda Coleman, Amanda Coleman [id number]) Diagnosis(continued) - DUCTAL CARCINOMA IN SITU IS BROADLY LESS THAN 0.1 CM FROM THE ANTERIOR SOFT TISSUE RESECTION MARGIN. - SEE ONCOLOGY TABLE BELOW. 2. Lymph node, sentinel, biopsy, Left axillary #1 - THERE IS NO EVIDENCE OF CARCINOMA IN 1 OF 1 LYMPH NODE (0/1). 3. Lymph node, sentinel, biopsy, Left axillary #2 - THERE IS NO EVIDENCE OF CARCINOMA IN 1 OF 1 LYMPH NODE (0/1). 4. Lymph node, sentinel, biopsy, Left axillary #3 - THERE IS NO EVIDENCE OF CARCINOMA IN 1 OF 1 LYMPH NODE (0/1). Microscopic Comment 1. BREAST, INVASIVE TUMOR, WITH LYMPH NODE SAMPLING Specimen, including laterality and lymph node sampling (sentinel, non-sentinel): Left breast and sentinel nodes. Procedure: Simple mastectomy and lymph node dissection. Histologic type: Ductal carcinoma. Grade: II. Tubule formation: 3 Nuclear pleomorphism: 2 Mitotic:1 Tumor size (gross measurement): 4.2 cm Margins: Invasive, distance to closest margin: Greater than 0.2 cm to all margins. In-situ, distance to closest margin: Broadly less than 0.1 cm from the anterior soft tissue resection margin. Lymphovascular  invasion: Not identified. Ductal carcinoma in situ: Present. Grade: Intermediate grade. Extensive intraductal component: Yes. Lobular neoplasia: Not Identified. Tumor focality: Multifocal. Treatment effect: N/A Extent of tumor: Confined to breast parenchyma. Lymph nodes: Examined: 3 Sentinel 0 Non-sentinel 3 Total Lymph nodes with metastasis: 0 Breast prognostic profile: [phone] Estrogen receptor: 100%, strong staining intensity. Progesterone receptor: 100%, strong staining intensity. Her 2 neu: No amplification was detected. The ratio was 1.12. Her 2 neu will be repeated on the current [place] and the results reported separately. Ki-67: 11%. Non-neoplastic breast: No significant findings. TNM: pT2, pN0 Comments:  The vast majority of the carcinoma is in situ. However, throughout the 4.2 cm grossly identified lesion are small foci of grade II invasive ductal carcinoma, thus the assigned stage. In addition, there is ductal carcinoma in situ present in random tissue submitted from the lower inner quadrant. (JBK:gt, 04/14/13) 2 of 4 FINAL for Amanda Coleman, Amanda Coleman [phone]) Microscopic Comment(continued)   RADIOGRAPHIC STUDIES:  Nm Sentinel Node Inj-no Rpt (breast)  04/13/2013   CLINICAL DATA: left axillary sentinel node biopsy   Sulfur colloid was injected intradermally by the nuclear medicine  technologist for breast cancer sentinel node localization.     ASSESSMENT: 47 year old female with  #1 N0( invasive and intraductal carcinoma of the left breast) patient is status post mastectomy with immediate reconstruction. She did have a close margin  Less than a millimeter. He was negative though. Patient's had Oncotype DX sent it was not done due to insufficient tissue.  #2We discussed adjuvant therapy. My recommendation is chemotherapy followed by antiestrogen therapy. Chemotherapy would be consisting of Taxotere and Cytoxan for 4 cycles. This decision is made after discussing it with the whole [facility] team. When I mentioned chemotherapy to the patient she is obviously devastated. However at the end of 40 minutes she was okay and would like to proceed with it.   PLAN:   #1 patient will need a Port-A-Cath placed, and we will get  Her for chemotherapy teaching class.  #2 we will plan on starting the chemotherapy as soon as possible.   All questions were answered. The patient knows to call the clinic with any problems, questions or concerns. We can certainly see the patient much sooner if necessary.  I spent 40 minutes counseling the patient face to face. The total time spent in the appointment was 40 minutes.    Drue Second, MD Medical/Oncology [facility] [phone]  (beeper) [phone] (Office)  05/03/2013, 10:25 AM

## 2013-05-08 ENCOUNTER — Telehealth (INDEPENDENT_AMBULATORY_CARE_PROVIDER_SITE_OTHER): Payer: Self-pay

## 2013-05-08 ENCOUNTER — Telehealth: Payer: Self-pay | Admitting: *Deleted

## 2013-05-08 NOTE — Telephone Encounter (Signed)
sw pt spent a lot of time with her over the phone explaining all of her appts. Hopefully she's happy now she was worried because the nurse never returned her call today...td

## 2013-05-08 NOTE — Telephone Encounter (Signed)
Pt calling in today very teary just wanting to talk about things with her breast cancer plan. The pt saw Dr Welton Flakes last week which told her she was going to need chemotherapy and a PAC. The pt is calling today to our office b/c she has not heard anymore about the [facility] being arranged or chemo teaching class. I advised pt that I would notify Dr Dwain Sarna of the [facility] and he would start the surgical orders on her ASAP. The pt has not been given a chemo start date yet but the chemo class has not been scheduled yet either. I advised pt that she should call the [facility] when she got off the phone with me to see on the chemo class. I was going to send Dawn the breast navigator a message also. I advised pt that Alvis Lemmings is good contact person to have at [facility] if she is needing help.  The pt is very anxious and upset b/c she was totally shocked about chemo needing to be done. I advised pt that Dr Dwain Sarna might want to see her again real soon. I advised he has been on call at the hospital all last week and off today. I advised Dr Dwain Sarna would give me a message tomorrow on her plan of care and I would call her Tuesday once I speak to Dr Dwain Sarna. The pt felt much better after speaking with me and knowing that Dr Dwain Sarna will help her tomorrow. I will send Dawn a message also.

## 2013-05-09 ENCOUNTER — Telehealth: Payer: Self-pay | Admitting: *Deleted

## 2013-05-09 NOTE — Telephone Encounter (Signed)
Per staff message and POF I have scheduled appts.  JMW  

## 2013-05-10 ENCOUNTER — Other Ambulatory Visit (INDEPENDENT_AMBULATORY_CARE_PROVIDER_SITE_OTHER): Payer: Self-pay | Admitting: General Surgery

## 2013-05-10 ENCOUNTER — Other Ambulatory Visit: Payer: BC Managed Care – PPO

## 2013-05-10 ENCOUNTER — Encounter (HOSPITAL_COMMUNITY): Payer: Self-pay | Admitting: Pharmacy Technician

## 2013-05-10 NOTE — Patient Instructions (Signed)
Amanda Coleman  05/10/2013   Your procedure is scheduled on:  05/18/13              Surgery 1200noon-100pm  Report to [facility] at    0930  AM.  Call this number if you have problems the morning of surgery: [phone]   Remember:   Do not eat food or drink liquids after midnight.   Take these medicines the morning of surgery with A SIP OF WATER:    Do not wear jewelry, make-up or nail polish.  Do not wear lotions, powders, or perfumes.   Do not shave 48 hours prior to surgery.   Do not bring valuables to the hospital.  Contacts, dentures or bridgework may not be worn into surgery.      Patients discharged the day of surgery will not be allowed to drive  home.  Name and phone number of your driver:    SEE CHG INSTRUCTION SHEET    Please read over the following fact sheets that you were given:  coughing and deep breathing exercises, leg exercises               Failure to comply with these instructions may result in cancellation of your surgery.                Patient Signature ____________________________              Nurse Signature _____________________________

## 2013-05-11 ENCOUNTER — Encounter (HOSPITAL_COMMUNITY): Payer: Self-pay

## 2013-05-11 ENCOUNTER — Other Ambulatory Visit: Payer: BC Managed Care – PPO

## 2013-05-11 ENCOUNTER — Other Ambulatory Visit: Payer: Self-pay | Admitting: Medical Oncology

## 2013-05-11 ENCOUNTER — Encounter (INDEPENDENT_AMBULATORY_CARE_PROVIDER_SITE_OTHER): Payer: Self-pay | Admitting: General Surgery

## 2013-05-11 ENCOUNTER — Encounter (HOSPITAL_COMMUNITY)
Admission: RE | Admit: 2013-05-11 | Discharge: 2013-05-11 | Disposition: A | Discharge status: Home / Self Care | Payer: BC Managed Care – PPO | Source: Ambulatory Visit | Attending: General Surgery | Admitting: General Surgery

## 2013-05-11 ENCOUNTER — Ambulatory Visit (INDEPENDENT_AMBULATORY_CARE_PROVIDER_SITE_OTHER): Payer: BC Managed Care – PPO | Admitting: General Surgery

## 2013-05-11 VITALS — BP 128/76 | HR 76 | Temp 97.9°F | Resp 14 | Ht 60.0 in | Wt 141.0 lb

## 2013-05-11 DIAGNOSIS — Z01818 Encounter for other preprocedural examination: Secondary | ICD-10-CM | POA: Insufficient documentation

## 2013-05-11 DIAGNOSIS — Z01812 Encounter for preprocedural laboratory examination: Secondary | ICD-10-CM | POA: Insufficient documentation

## 2013-05-11 DIAGNOSIS — Z09 Encounter for follow-up examination after completed treatment for conditions other than malignant neoplasm: Secondary | ICD-10-CM

## 2013-05-11 HISTORY — DX: Gastro-esophageal reflux disease without esophagitis: K21.9

## 2013-05-11 HISTORY — DX: Nausea with vomiting, unspecified: R11.2

## 2013-05-11 HISTORY — DX: Other specified postprocedural states: Z98.890

## 2013-05-11 LAB — CBC
Hemoglobin: 10.9 g/dL — ABNORMAL LOW (ref 12.0–15.0)
MCV: 90.6 fL (ref 78.0–100.0)
Platelets: 294 10*3/uL (ref 150–400)
RBC: 3.72 MIL/uL — ABNORMAL LOW (ref 3.87–5.11)
RDW: 13 % (ref 11.5–15.5)
WBC: 5.8 10*3/uL (ref 4.0–10.5)

## 2013-05-11 MED ORDER — DEXAMETHASONE 4 MG PO TABS: ORAL_TABLET | ORAL | Status: DC

## 2013-05-11 MED ORDER — LIDOCAINE-PRILOCAINE 2.5-2.5 % EX CREA: TOPICAL_CREAM | CUTANEOUS | Status: DC | PRN

## 2013-05-11 MED ORDER — PROCHLORPERAZINE MALEATE 10 MG PO TABS: 10.0000 mg | ORAL_TABLET | Freq: Four times a day (QID) | ORAL | Status: DC | PRN

## 2013-05-11 MED ORDER — ONDANSETRON HCL 8 MG PO TABS: ORAL_TABLET | ORAL | Status: DC

## 2013-05-11 NOTE — Progress Notes (Signed)
Subjective:     Patient ID: Amanda Coleman, female   DOB: November 11, 1965, 47 y.o.   MRN: [id number]  HPI [name] who recently underwent a left simple mastectomy and sentinel node biopsy for a stage II breast cancer. Her DCIS is probably less than 1 mm from the anterior soft tissue resection margin but this is only skin. There is no other area to remove. She is doing well today except for a little bit of drainage from a part of her incision and the drain site is still leaking a little bit. She has been seen by medical oncology is due to begin chemotherapy. She is here to discuss port placement.  Review of Systems     Objective:   Physical Exam Drain site without infection with a small amount of serosanguineous drainage Her wound is healed well without any evidence of infection. There is an area in the superior flap that is healing. She has a small superficial skin separation of her incision that accounts for her drainage but I think this should heal on its own    Assessment:      stage II left breast cancer     Plan:     She is going to continue caring for her incision as well as her drain site is discussed.  We discussed port placement next week with the risks including but not limited to bleeding, infection, pneumothorax. She understands and we will proceed next week for her chemotherapy. She understands the rationale and indications for chemotherapy we discussed this as well today.

## 2013-05-15 ENCOUNTER — Encounter (INDEPENDENT_AMBULATORY_CARE_PROVIDER_SITE_OTHER): Payer: BC Managed Care – PPO | Admitting: General Surgery

## 2013-05-15 ENCOUNTER — Telehealth: Payer: Self-pay | Admitting: *Deleted

## 2013-05-15 NOTE — Telephone Encounter (Signed)
Called pt to discuss chemotherapy regimen and anti-nausea medications.  Gave pt a great amount of emotional support and discussed each anti-nausea medication in detail.  Pt very appreciative and would like to call once she has medications in hand to discuss them again.  Informed pt that would be perfectly fine.  Gave pt contact information.  Pt denies further needs or concerns.  Encourage pt to call with questions.  Received verbal understanding.

## 2013-05-16 ENCOUNTER — Telehealth: Payer: Self-pay | Admitting: *Deleted

## 2013-05-16 NOTE — Telephone Encounter (Signed)
Spoke to pt concerning anti-nausea medications.  Discussed how and when to take.  Gave pt emotional support.  Discussed f/u appt and 1st chemotherapy appt as well as port insertion date.  Pt denies further needs.

## 2013-05-18 ENCOUNTER — Encounter: Payer: Self-pay | Admitting: Oncology

## 2013-05-18 ENCOUNTER — Encounter (HOSPITAL_COMMUNITY): Payer: Self-pay | Admitting: *Deleted

## 2013-05-18 ENCOUNTER — Ambulatory Visit (HOSPITAL_COMMUNITY)
Admission: RE | Admit: 2013-05-18 | Discharge: 2013-05-18 | Disposition: A | Discharge status: Home / Self Care | Payer: BC Managed Care – PPO | Source: Ambulatory Visit | Attending: General Surgery | Admitting: General Surgery

## 2013-05-18 ENCOUNTER — Encounter (HOSPITAL_COMMUNITY)
Admission: RE | Disposition: A | Discharge status: Home / Self Care | Payer: Self-pay | Source: Ambulatory Visit | Attending: General Surgery

## 2013-05-18 ENCOUNTER — Encounter (HOSPITAL_COMMUNITY): Payer: Self-pay | Admitting: Anesthesiology

## 2013-05-18 ENCOUNTER — Ambulatory Visit (HOSPITAL_COMMUNITY): Payer: BC Managed Care – PPO

## 2013-05-18 ENCOUNTER — Ambulatory Visit (HOSPITAL_COMMUNITY): Payer: BC Managed Care – PPO | Admitting: Anesthesiology

## 2013-05-18 DIAGNOSIS — D0590 Unspecified type of carcinoma in situ of unspecified breast: Secondary | ICD-10-CM

## 2013-05-18 DIAGNOSIS — C50319 Malignant neoplasm of lower-inner quadrant of unspecified female breast: Secondary | ICD-10-CM | POA: Insufficient documentation

## 2013-05-18 HISTORY — PX: PORTACATH PLACEMENT: SHX2246

## 2013-05-18 SURGERY — INSERTION, TUNNELED CENTRAL VENOUS DEVICE, WITH PORT
Anesthesia: General | Site: Chest | Wound class: Clean

## 2013-05-18 MED ORDER — ONDANSETRON HCL 4 MG/2ML IJ SOLN
INTRAMUSCULAR | Status: DC | PRN
  Administered 2013-05-18: 4 mg via INTRAVENOUS

## 2013-05-18 MED ORDER — BUPIVACAINE HCL (PF) 0.25 % IJ SOLN
INTRAMUSCULAR | Status: DC | PRN
  Administered 2013-05-18: 6 mL

## 2013-05-18 MED ORDER — ONDANSETRON HCL 4 MG/2ML IJ SOLN: 4.0000 mg | Freq: Four times a day (QID) | INTRAMUSCULAR | Status: DC | PRN

## 2013-05-18 MED ORDER — HYDROCODONE-ACETAMINOPHEN 10-325 MG PO TABS: 1.0000 | ORAL_TABLET | Freq: Four times a day (QID) | ORAL | Status: DC | PRN

## 2013-05-18 MED ORDER — ACETAMINOPHEN 325 MG PO TABS: 650.0000 mg | ORAL_TABLET | ORAL | Status: DC | PRN

## 2013-05-18 MED ORDER — MEPERIDINE HCL 50 MG/ML IJ SOLN: 6.2500 mg | INTRAMUSCULAR | Status: DC | PRN

## 2013-05-18 MED ORDER — BUPIVACAINE HCL (PF) 0.25 % IJ SOLN
INTRAMUSCULAR | Status: AC
  Filled 2013-05-18: qty 30

## 2013-05-18 MED ORDER — LACTATED RINGERS IV SOLN
INTRAVENOUS | Status: DC
  Administered 2013-05-18: 1000 mL via INTRAVENOUS

## 2013-05-18 MED ORDER — DEXAMETHASONE SODIUM PHOSPHATE 10 MG/ML IJ SOLN
INTRAMUSCULAR | Status: DC | PRN
  Administered 2013-05-18: 10 mg via INTRAVENOUS

## 2013-05-18 MED ORDER — SODIUM CHLORIDE 0.9 % IR SOLN
Status: DC
  Filled 2013-05-18: qty 1.2

## 2013-05-18 MED ORDER — SODIUM CHLORIDE 0.9 % IR SOLN
Status: DC | PRN
  Administered 2013-05-18: 12:00:00

## 2013-05-18 MED ORDER — HEPARIN SOD (PORK) LOCK FLUSH 100 UNIT/ML IV SOLN
INTRAVENOUS | Status: DC | PRN
  Administered 2013-05-18: 400 [IU] via INTRAVENOUS

## 2013-05-18 MED ORDER — VANCOMYCIN HCL IN DEXTROSE 1-5 GM/200ML-% IV SOLN
1000.0000 mg | INTRAVENOUS | Status: AC
  Administered 2013-05-18: 1000 mg via INTRAVENOUS

## 2013-05-18 MED ORDER — PROPOFOL 10 MG/ML IV BOLUS
INTRAVENOUS | Status: DC | PRN
  Administered 2013-05-18: 130 mg via INTRAVENOUS

## 2013-05-18 MED ORDER — ACETAMINOPHEN 650 MG RE SUPP
650.0000 mg | RECTAL | Status: DC | PRN
  Filled 2013-05-18: qty 1

## 2013-05-18 MED ORDER — FENTANYL CITRATE 0.05 MG/ML IJ SOLN
INTRAMUSCULAR | Status: DC | PRN
  Administered 2013-05-18: 50 ug via INTRAVENOUS
  Administered 2013-05-18: 25 ug via INTRAVENOUS

## 2013-05-18 MED ORDER — FENTANYL CITRATE 0.05 MG/ML IJ SOLN
INTRAMUSCULAR | Status: AC
  Filled 2013-05-18: qty 2

## 2013-05-18 MED ORDER — HEPARIN SOD (PORK) LOCK FLUSH 100 UNIT/ML IV SOLN
INTRAVENOUS | Status: AC
  Filled 2013-05-18: qty 5

## 2013-05-18 MED ORDER — PROMETHAZINE HCL 25 MG/ML IJ SOLN: 6.2500 mg | INTRAMUSCULAR | Status: DC | PRN

## 2013-05-18 MED ORDER — OXYCODONE HCL 5 MG PO TABS: 5.0000 mg | ORAL_TABLET | ORAL | Status: DC | PRN

## 2013-05-18 MED ORDER — LACTATED RINGERS IV SOLN
INTRAVENOUS | Status: DC | PRN
  Administered 2013-05-18: 11:00:00 via INTRAVENOUS

## 2013-05-18 MED ORDER — MORPHINE SULFATE 10 MG/ML IJ SOLN: 2.0000 mg | INTRAMUSCULAR | Status: DC | PRN

## 2013-05-18 MED ORDER — VANCOMYCIN HCL IN DEXTROSE 1-5 GM/200ML-% IV SOLN
INTRAVENOUS | Status: AC
  Filled 2013-05-18: qty 200

## 2013-05-18 MED ORDER — FENTANYL CITRATE 0.05 MG/ML IJ SOLN
25.0000 ug | INTRAMUSCULAR | Status: DC | PRN
  Administered 2013-05-18 (×2): 50 ug via INTRAVENOUS

## 2013-05-18 MED ORDER — MIDAZOLAM HCL 5 MG/5ML IJ SOLN
INTRAMUSCULAR | Status: DC | PRN
  Administered 2013-05-18: 2 mg via INTRAVENOUS

## 2013-05-18 MED ORDER — SODIUM CHLORIDE 0.9 % IV SOLN: INTRAVENOUS | Status: DC

## 2013-05-18 SURGICAL SUPPLY — 44 items
BAG DECANTER FOR FLEXI CONT (MISCELLANEOUS) ×2 IMPLANT
BENZOIN TINCTURE PRP APPL 2/3 (GAUZE/BANDAGES/DRESSINGS) IMPLANT
BLADE HEX COATED 2.75 (ELECTRODE) ×2 IMPLANT
BLADE SURG 15 STRL LF DISP TIS (BLADE) ×1 IMPLANT
BLADE SURG 15 STRL SS (BLADE) ×1
BLADE SURG SZ11 CARB STEEL (BLADE) ×2 IMPLANT
CLOTH BEACON ORANGE TIMEOUT ST (SAFETY) ×2 IMPLANT
DECANTER SPIKE VIAL GLASS SM (MISCELLANEOUS) IMPLANT
DERMABOND ADVANCED (GAUZE/BANDAGES/DRESSINGS) ×1
DERMABOND ADVANCED .7 DNX12 (GAUZE/BANDAGES/DRESSINGS) ×1 IMPLANT
DRAPE C-ARM 42X120 X-RAY (DRAPES) ×2 IMPLANT
DRAPE LAPAROSCOPIC ABDOMINAL (DRAPES) ×2 IMPLANT
DRSG TEGADERM 2-3/8X2-3/4 SM (GAUZE/BANDAGES/DRESSINGS) IMPLANT
DRSG TEGADERM 4X4.75 (GAUZE/BANDAGES/DRESSINGS) IMPLANT
ELECT REM PT RETURN 9FT ADLT (ELECTROSURGICAL) ×2
ELECTRODE REM PT RTRN 9FT ADLT (ELECTROSURGICAL) ×1 IMPLANT
GAUZE SPONGE 4X4 16PLY XRAY LF (GAUZE/BANDAGES/DRESSINGS) ×2 IMPLANT
GLOVE BIO SURGEON STRL SZ7 (GLOVE) ×2 IMPLANT
GLOVE BIOGEL PI IND STRL 7.0 (GLOVE) ×1 IMPLANT
GLOVE BIOGEL PI IND STRL 7.5 (GLOVE) IMPLANT
GLOVE BIOGEL PI INDICATOR 7.0 (GLOVE) ×1
GLOVE BIOGEL PI INDICATOR 7.5 (GLOVE)
GOWN PREVENTION PLUS LG XLONG (DISPOSABLE) ×4 IMPLANT
GOWN PREVENTION PLUS XLARGE (GOWN DISPOSABLE) ×2 IMPLANT
GOWN STRL REIN XL XLG (GOWN DISPOSABLE) IMPLANT
KIT BASIN OR (CUSTOM PROCEDURE TRAY) ×2 IMPLANT
KIT PORT POWER ISP 8FR (Catheter) IMPLANT
KIT POWER CATH 8FR (Catheter) IMPLANT
KIT POWER PORT SLIM 6FR (PORTABLE EQUIPMENT SUPPLIES) ×2 IMPLANT
NEEDLE HYPO 25X1 1.5 SAFETY (NEEDLE) ×2 IMPLANT
NS IRRIG 1000ML POUR BTL (IV SOLUTION) ×2 IMPLANT
PACK BASIC VI WITH GOWN DISP (CUSTOM PROCEDURE TRAY) ×2 IMPLANT
PENCIL BUTTON HOLSTER BLD 10FT (ELECTRODE) ×2 IMPLANT
SPONGE GAUZE 4X4 12PLY (GAUZE/BANDAGES/DRESSINGS) IMPLANT
STRIP CLOSURE SKIN 1/2X4 (GAUZE/BANDAGES/DRESSINGS) IMPLANT
SUT MNCRL AB 4-0 PS2 18 (SUTURE) ×2 IMPLANT
SUT PROLENE 2 0 SH DA (SUTURE) ×2 IMPLANT
SUT SILK 2 0 (SUTURE)
SUT SILK 2-0 30XBRD TIE 12 (SUTURE) IMPLANT
SUT VIC AB 3-0 SH 27 (SUTURE) ×1
SUT VIC AB 3-0 SH 27XBRD (SUTURE) ×1 IMPLANT
SYR CONTROL 10ML LL (SYRINGE) ×2 IMPLANT
SYRINGE 10CC LL (SYRINGE) ×2 IMPLANT
TOWEL OR 17X26 10 PK STRL BLUE (TOWEL DISPOSABLE) ×2 IMPLANT

## 2013-05-18 NOTE — Preoperative (Signed)
Beta Blockers   Reason not to administer Beta Blockers:Not Applicable 

## 2013-05-18 NOTE — Anesthesia Preprocedure Evaluation (Signed)
Anesthesia Evaluation  Patient identified by MRN, date of birth, ID band Patient awake    Reviewed: Allergy & Precautions, H&P , NPO status , Patient's Chart, lab work & pertinent test results  History of Anesthesia Complications (+) PONV  Airway Mallampati: II TM Distance: >3 FB Neck ROM: Full    Dental no notable dental hx.    Pulmonary neg pulmonary ROS,  breath sounds clear to auscultation  Pulmonary exam normal       Cardiovascular negative cardio ROS  Rhythm:Regular Rate:Normal     Neuro/Psych negative neurological ROS  negative psych ROS   GI/Hepatic negative GI ROS, Neg liver ROS,   Endo/Other  Hypothyroidism   Renal/GU negative Renal ROS  negative genitourinary   Musculoskeletal negative musculoskeletal ROS (+)   Abdominal   Peds negative pediatric ROS (+)  Hematology negative hematology ROS (+)   Anesthesia Other Findings   Reproductive/Obstetrics negative OB ROS                           Anesthesia Physical Anesthesia Plan  ASA: II  Anesthesia Plan: General   Post-op Pain Management:    Induction: Intravenous  Airway Management Planned: LMA  Additional Equipment:   Intra-op Plan:   Post-operative Plan: Extubation in OR  Informed Consent: I have reviewed the patients History and Physical, chart, labs and discussed the procedure including the risks, benefits and alternatives for the proposed anesthesia with the patient or authorized representative who has indicated his/her understanding and acceptance.   Dental advisory given  Plan Discussed with: CRNA  Anesthesia Plan Comments:         Anesthesia Quick Evaluation

## 2013-05-18 NOTE — Progress Notes (Signed)
Pt's IV site red around hub of IV insertion site.  No swelling noted. Pt. States tender at insertion site only.  Warm compress applied.

## 2013-05-18 NOTE — Transfer of Care (Signed)
Immediate Anesthesia Transfer of Care Note  Patient: Amanda Coleman  Procedure(s) Performed: Procedure(s): INSERTION PORT-A-CATH (N/A)  Patient Location: PACU  Anesthesia Type:General  Level of Consciousness: awake, alert , oriented and patient cooperative  Airway & Oxygen Therapy: Patient Spontanous Breathing and Patient connected to face mask oxygen  Post-op Assessment: Report given to PACU RN and Post -op Vital signs reviewed and stable  Post vital signs: Reviewed and stable  Complications: No apparent anesthesia complications

## 2013-05-18 NOTE — H&P (View-Only) (Signed)
Subjective:     Patient ID: Amanda Coleman, female   DOB: [birth year], 47 y.o.   MRN: [id number]  HPI [name] who recently underwent a left simple mastectomy and sentinel node biopsy for a stage II breast cancer. Her DCIS is probably less than 1 mm from the anterior soft tissue resection margin but this is only skin. There is no other area to remove. She is doing well today except for a little bit of drainage from a part of her incision and the drain site is still leaking a little bit. She has been seen by medical oncology is due to begin chemotherapy. She is here to discuss port placement.  Review of Systems     Objective:   Physical Exam Drain site without infection with a small amount of serosanguineous drainage Her wound is healed well without any evidence of infection. There is an area in the superior flap that is healing. She has a small superficial skin separation of her incision that accounts for her drainage but I think this should heal on its own    Assessment:      stage II left breast cancer     Plan:     She is going to continue caring for her incision as well as her drain site is discussed.  We discussed port placement next week with the risks including but not limited to bleeding, infection, pneumothorax. She understands and we will proceed next week for her chemotherapy. She understands the rationale and indications for chemotherapy we discussed this as well today.

## 2013-05-18 NOTE — Progress Notes (Signed)
Patient had left a message and I called her back. She is concerned her insurance will exp on 07/17/13. I advised her to check into new coverage, because she said her treatment is suppose to go thru mid Dec. She wanted advise on getting insurance. I advised her we can't do that. We can advised her to make sure we are in network with whatever coverage she chooses if she wants to see our doctors. She will be in next week and will sign the Neulasta copay asst form

## 2013-05-18 NOTE — Interval H&P Note (Signed)
History and Physical Interval Note:  05/18/2013 11:09 AM  Amanda Coleman  has presented today for surgery, with the diagnosis of breast cancer  The various methods of treatment have been discussed with the patient and family. After consideration of risks, benefits and other options for treatment, the patient has consented to  Procedure(s): INSERTION PORT-A-CATH (N/A) as a surgical intervention .  The patient's history has been reviewed, patient examined, no change in status, stable for surgery.  I have reviewed the patient's chart and labs.  Questions were answered to the patient's satisfaction.     Dailey Alberson

## 2013-05-18 NOTE — Progress Notes (Signed)
Pt's IV site has improved and reddness is gone. Pt states "it feels much better".  Site appears intact and without bruising and swelling. Warm compress remains on site.  IV has been discontinued.

## 2013-05-18 NOTE — Op Note (Signed)
Preoperative diagnosis: Stage II left breast cancer status post mastectomy, sentinel mode, expander reconstruction Postoperative diagnosis: Same as above Procedure: Right subclavian power port insertion Surgeon: Dr. Harden Mo Anesthesia: Gen. With LMA Estimated blood loss: Minimal Drains: None Complications: None Specimens: None Sponge and needle count correct at completion Disposition to recovery stable  Indications: [name] who presented with a large lower inner quadrant left breast mass. We decided to proceed with mastectomy and immediate expander reconstruction with sentinel lymph node biopsy. Her tumor is over 4 cm in size. She has been seen by medical oncology in decision has been made to proceed with adjuvant chemotherapy. She has done well from her surgery. She and I discussed port placement for access for chemotherapy.  Procedure: After informed consent was obtained the patient was taken to the operating room. She was given 1 g of vancomycin. This was due to her penicillin allergy. She had sequential compression devices on her legs. She was placed under general anesthesia with an LMA. Her arms were tucked and appropriately padded. An axillary roll was placed. She was prepped and draped in the standard sterile surgical fashion. A surgical timeout was performed.  I infiltrated Marcaine throughout her right chest as well as onto her clavicle. I then accessed her subclavian vein on the first pass. The wire was placed and confirmed to be in position by fluoroscopy. I then made an incision below this and developed a pocket overlying the pectoralis fascia. A tunnel the line between the 2 sites. I then dilated the tract. I then inserted the dilator and peel-away sheath assembly under fluoroscopic vision into position. The wire was then removed. The line was then placed and the peel-away sheath was removed. The line was brought back to be in the distal cava. I then secured  the line to the port. This was then sutured into position with 2-0 Prolene suture. The port flushed easily and aspirated blood. This was packed with heparin. I closed  with 3-0 Vicryl, 4-0 Monocryl, and Dermabond. She tolerated this well was extubated and transferred to recovery stable.

## 2013-05-19 ENCOUNTER — Telehealth: Payer: Self-pay | Admitting: Oncology

## 2013-05-19 ENCOUNTER — Encounter (HOSPITAL_COMMUNITY): Payer: Self-pay | Admitting: General Surgery

## 2013-05-19 NOTE — Anesthesia Postprocedure Evaluation (Signed)
  Anesthesia Post-op Note  Patient: Amanda Coleman  Procedure(s) Performed: Procedure(s) (LRB): INSERTION PORT-A-CATH (N/A)  Patient Location: PACU  Anesthesia Type: General  Level of Consciousness: awake and alert   Airway and Oxygen Therapy: Patient Spontanous Breathing  Post-op Pain: mild  Post-op Assessment: Post-op Vital signs reviewed, Patient's Cardiovascular Status Stable, Respiratory Function Stable, Patent Airway and No signs of Nausea or vomiting  Last Vitals:  Filed Vitals:   05/18/13 1500  BP: 130/63  Pulse: 70  Temp: 36.6 C  Resp: 16    Post-op Vital Signs: stable   Complications: No apparent anesthesia complications

## 2013-05-19 NOTE — Telephone Encounter (Signed)
, °

## 2013-05-22 ENCOUNTER — Telehealth: Payer: Self-pay | Admitting: *Deleted

## 2013-05-22 NOTE — Telephone Encounter (Signed)
Discuss anti-nausea medications with pt in detail and gave emotional support.  Pt is extremely anxious about first chemo on 10/9.  Called in script for lorazepam 0.5mg  per Dr. Welton Flakes order to [facility] [phone].  Pt denies further needs.  Confirmed appt date and time with pt. Encourage pt to call with needs.

## 2013-05-25 ENCOUNTER — Other Ambulatory Visit: Payer: BC Managed Care – PPO | Admitting: Lab

## 2013-05-25 ENCOUNTER — Ambulatory Visit (HOSPITAL_BASED_OUTPATIENT_CLINIC_OR_DEPARTMENT_OTHER): Payer: BC Managed Care – PPO

## 2013-05-25 ENCOUNTER — Telehealth: Payer: Self-pay | Admitting: Oncology

## 2013-05-25 ENCOUNTER — Ambulatory Visit: Payer: BC Managed Care – PPO | Admitting: Oncology

## 2013-05-25 ENCOUNTER — Encounter: Payer: Self-pay | Admitting: Family

## 2013-05-25 ENCOUNTER — Telehealth: Payer: Self-pay | Admitting: *Deleted

## 2013-05-25 ENCOUNTER — Ambulatory Visit (HOSPITAL_BASED_OUTPATIENT_CLINIC_OR_DEPARTMENT_OTHER): Payer: BC Managed Care – PPO | Admitting: Family

## 2013-05-25 ENCOUNTER — Other Ambulatory Visit (HOSPITAL_BASED_OUTPATIENT_CLINIC_OR_DEPARTMENT_OTHER): Payer: BC Managed Care – PPO | Admitting: Lab

## 2013-05-25 ENCOUNTER — Other Ambulatory Visit: Payer: Self-pay | Admitting: Oncology

## 2013-05-25 VITALS — BP 119/79 | HR 67 | Temp 97.1°F

## 2013-05-25 VITALS — BP 137/88 | HR 87 | Temp 98.7°F | Resp 19 | Ht 60.0 in | Wt 139.0 lb

## 2013-05-25 DIAGNOSIS — C50912 Malignant neoplasm of unspecified site of left female breast: Secondary | ICD-10-CM

## 2013-05-25 DIAGNOSIS — Z5111 Encounter for antineoplastic chemotherapy: Secondary | ICD-10-CM

## 2013-05-25 DIAGNOSIS — C50919 Malignant neoplasm of unspecified site of unspecified female breast: Secondary | ICD-10-CM

## 2013-05-25 DIAGNOSIS — C50512 Malignant neoplasm of lower-outer quadrant of left female breast: Secondary | ICD-10-CM

## 2013-05-25 DIAGNOSIS — Z170 Estrogen receptor positive status [ER+]: Secondary | ICD-10-CM

## 2013-05-25 LAB — CBC WITH DIFFERENTIAL/PLATELET
BASO%: 0.3 % (ref 0.0–2.0)
EOS%: 0 % (ref 0.0–7.0)
HCT: 36.2 % (ref 34.8–46.6)
LYMPH%: 10.8 % — ABNORMAL LOW (ref 14.0–49.7)
MCH: 28.9 pg (ref 25.1–34.0)
MCHC: 32.7 g/dL (ref 31.5–36.0)
MCV: 88.5 fL (ref 79.5–101.0)
MONO#: 0.8 10*3/uL (ref 0.1–0.9)
Platelets: 253 10*3/uL (ref 145–400)
RBC: 4.09 10*6/uL (ref 3.70–5.45)
RDW: 13.1 % (ref 11.2–14.5)
WBC: 13.2 10*3/uL — ABNORMAL HIGH (ref 3.9–10.3)
lymph#: 1.4 10*3/uL (ref 0.9–3.3)

## 2013-05-25 LAB — COMPREHENSIVE METABOLIC PANEL (CC13)
ALT: 12 U/L (ref 0–55)
AST: 14 U/L (ref 5–34)
Albumin: 4 g/dL (ref 3.5–5.0)
Anion Gap: 9 mEq/L (ref 3–11)
CO2: 23 mEq/L (ref 22–29)
Calcium: 9.6 mg/dL (ref 8.4–10.4)
Chloride: 110 mEq/L — ABNORMAL HIGH (ref 98–109)
Glucose: 87 mg/dl (ref 70–140)
Potassium: 4.4 mEq/L (ref 3.5–5.1)
Sodium: 142 mEq/L (ref 136–145)
Total Protein: 7.3 g/dL (ref 6.4–8.3)

## 2013-05-25 MED ORDER — DEXAMETHASONE SODIUM PHOSPHATE 20 MG/5ML IJ SOLN
20.0000 mg | Freq: Once | INTRAMUSCULAR | Status: AC
  Administered 2013-05-25: 20 mg via INTRAVENOUS

## 2013-05-25 MED ORDER — ONDANSETRON 16 MG/50ML IVPB (CHCC)
16.0000 mg | Freq: Once | INTRAVENOUS | Status: AC
  Administered 2013-05-25: 16 mg via INTRAVENOUS

## 2013-05-25 MED ORDER — SODIUM CHLORIDE 0.9 % IJ SOLN
10.0000 mL | INTRAMUSCULAR | Status: DC | PRN
  Filled 2013-05-25: qty 10

## 2013-05-25 MED ORDER — HEPARIN SOD (PORK) LOCK FLUSH 100 UNIT/ML IV SOLN
500.0000 [IU] | Freq: Once | INTRAVENOUS | Status: DC | PRN
  Filled 2013-05-25: qty 5

## 2013-05-25 MED ORDER — DEXAMETHASONE SODIUM PHOSPHATE 20 MG/5ML IJ SOLN
INTRAMUSCULAR | Status: AC
  Filled 2013-05-25: qty 5

## 2013-05-25 MED ORDER — DOCETAXEL CHEMO INJECTION 160 MG/16ML
75.0000 mg/m2 | Freq: Once | INTRAVENOUS | Status: AC
  Administered 2013-05-25: 130 mg via INTRAVENOUS
  Filled 2013-05-25: qty 13

## 2013-05-25 MED ORDER — ONDANSETRON 16 MG/50ML IVPB (CHCC)
INTRAVENOUS | Status: AC
  Filled 2013-05-25: qty 16

## 2013-05-25 MED ORDER — SODIUM CHLORIDE 0.9 % IV SOLN
600.0000 mg/m2 | Freq: Once | INTRAVENOUS | Status: AC
  Administered 2013-05-25: 1060 mg via INTRAVENOUS
  Filled 2013-05-25: qty 53

## 2013-05-25 MED ORDER — SODIUM CHLORIDE 0.9 % IV SOLN
Freq: Once | INTRAVENOUS | Status: AC
  Administered 2013-05-25: 12:00:00 via INTRAVENOUS

## 2013-05-25 NOTE — Progress Notes (Signed)
[facility]  [phone] [phone]  OFFICE PROGRESS NOTE   ID: Amanda Coleman   DOB: 1965/12/08  MR#: [id number]  JYN#:[id number]   PCP: Freddy Finner, MD RAD ONC: Lonie Peak, M.D.  SU: Emelia Loron, M.D. SUAlan Ripper Sanger, D.O.   DIAGNOSIS: Amanda Coleman is a 47 y.o. 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 Patient was seen in the multidisciplinary clinic on 03/29/2013.   STAGE:  Breast cancer of lower-outer quadrant of left female breast  Primary site: Breast (Left)  Staging method: AJCC 7th Edition  Clinical: Stage IIA (T2, N0, cM0)  Summary: Stage IIA (T2, N0, cM0)   PRIOR THERAPY: #1  For 3-4 months experienced a left nipple bloody discharge. She had a recent mammogram that did not show a mass. She had a physical examination which did show a mass measuring 3.4 cm at the 7 o'clock position. MRI on 03/24/2013 revealed the mass to be 5.0 cm. She went on to have a biopsy performed at the 7 o'clock position that showed invasive ductal carcinoma grade 1. Prognostic markers revealed the tumor to be estrogen receptor positive, progesterone receptor positive, HER-2/neu negative with a low Ki-67 11%.  #2  Patient is now status post mastectomy as of 04/13/2013. The final pathology did reveal invasive ductal carcinoma with ductal carcinoma in situ. Tumor is ER positive. We sent her tumor for Oncotype testing, but insufficient amount of tumor specimen meant testing could not be performed.  Patient's Gemma was discussed again at the multidisciplinary breast conference. It was recommended by the team because there was significant amount of invasive disease that she should get chemotherapy.  Dr. Welton Flakes then discussed with the patient and her husband role of chemotherapy.  #3 Dr. Welton Flakes and the patient discussed different chemotherapy options and decided upon Taxotere Cytoxan to be given every 3 weeks for a total of  4 cycles.   CURRENT THERAPY: Adjuvant chemotherapy consisting of every 3 week Taxotere/Cytoxan x 4 cycles with granulocyte support with Neulasta injection on day 2 of each cycle.    INTERVAL HISTORY: Amanda Coleman 47 y.o. female returns for followup visit to start adjuvant chemotherapy consisting of every three-week Taxotere/Cytoxan.  She is accompanied for today's office visit by her husband [name].  Her interval history is unremarkable and she does not have any complaints today.  We discussed her chemotherapy regimen again in detail and the patient has attended chemotherapy class.   MEDICAL HISTORY: Past Medical History  Diagnosis Date  . Thyroid disease   . Breast cancer   . Hashimoto's disease   . Hypothyroidism   . PONV (postoperative nausea and vomiting)   . GERD (gastroesophageal reflux disease)     ALLERGIES:   Allergies  Allergen Reactions  . Penicillins Swelling  . Sulfa Antibiotics Rash    MEDICATIONS:  Current Outpatient Prescriptions  Medication Sig Dispense Refill  . acetaminophen (TYLENOL) 500 MG tablet Take 500 mg by mouth every 6 (six) hours as needed for pain.      Marland Kitchen ALPRAZolam (XANAX) 0.5 MG tablet Take 0.5 mg by mouth as needed for sleep.      Marland Kitchen dexamethasone (DECADRON) 4 MG tablet Take 2 tablets (8mg  total) 2 times a day the day before Taxotere, then take 2 times a day starting the day after chemo for 3 days.  30 tablet  1  . diazepam (VALIUM) 2 MG tablet Take 2 mg by mouth daily as  needed (for muscle spasms).       Marland Kitchen HYDROcodone-acetaminophen (NORCO) 10-325 MG per tablet Take 1 tablet by mouth every 6 (six) hours as needed for pain.  20 tablet  0  . levothyroxine (SYNTHROID, LEVOTHROID) 112 MCG tablet Take 112 mcg by mouth daily before breakfast.      . lidocaine-prilocaine (EMLA) cream Apply topically as needed. Apply 1-2 hours to port-a-cath site prior to infusion appointment.  30 g  3  . LORazepam (ATIVAN) 0.5 MG tablet Take 0.5 mg by mouth every 6 (six)  hours as needed for anxiety (For nausea or vomiting).       . ondansetron (ZOFRAN) 8 MG tablet Take 2 times a day starting the day after chemo for 3 days, then take 2 times a day as needed for nausea and vomiting  30 tablet  1  . prochlorperazine (COMPAZINE) 10 MG tablet Take 1 tablet (10 mg total) by mouth every 6 (six) hours as needed.  30 tablet  1  . zolpidem (AMBIEN) 10 MG tablet Take 10 mg by mouth at bedtime as needed for sleep.       No current facility-administered medications for this visit.    SURGICAL HISTORY:  Past Surgical History  Procedure Laterality Date  . Abdominal hysterectomy    . Gum graft    . Simple mastectomy with axillary sentinel node biopsy Left 04/13/2013    Dr Dwain Sarna  . Mastectomy w/ sentinel node biopsy Left 04/13/2013    Procedure: LEFT SKIN SPARING MASTECTOMY WITH LEFT AXILLARY SENTINEL NODE BIOPSY;  Surgeon: Emelia Loron, MD;  Location: [facility] OR;  Service: General;  Laterality: Left;  . Breast reconstruction with placement of tissue expander and flex hd (acellular hydrated dermis) Left 04/13/2013    Procedure: LEFT BREAST RECONSTRUCTION WITH PLACEMENT OF TISSUE EXPANDER AND FLEX HD (ACELLULAR HYDRATED DERMIS);  Surgeon: Wayland Denis, DO;  Location: [facility] OR;  Service: Plastics;  Laterality: Left;  . Portacath placement N/A 05/18/2013    Procedure: INSERTION PORT-A-CATH;  Surgeon: Emelia Loron, MD;  Location: [facility] ORS;  Service: General;  Laterality: N/A;    REVIEW OF SYSTEMS:  A comprehensive review of systems was negative.   A 10 point review of systems was completed and is negative.  Amanda Coleman denies any symptomatology including fatigue, fever or chills, headache, vision changes, swollen glands, cough or shortness of breath, chest pain or discomfort, nausea, vomiting, diarrhea, constipation, change in urinary or bowel habits, arthralgias/myalgias, unusual bleeding/bruising or any other symptomatology.   PHYSICAL EXAMINATION: Blood pressure 137/88,  pulse 87, temperature 98.7 F (37.1 C), temperature source Oral, resp. rate 19, height 5' (1.524 m), weight 139 lb (63.05 kg). Body mass index is 27.15 kg/(m^2).  ECOG PERFORMANCE STATUS: 0 - Asymptomatic  General appearance: Alert, cooperative, well nourished, thin frame, mild distress Head: Normocephalic, without obvious abnormality, atraumatic Eyes: Conjunctivae/corneas clear, PERRLA, EOMI Nose: Nares, septum and mucosa are normal, no drainage or sinus tenderness Neck: No adenopathy, supple, symmetrical, trachea midline, no tenderness Resp: Clear to auscultation bilaterally, no wheezes/rales/rhonchi Cardio: Regular rate and rhythm, S1, S2 normal, no murmur, click, rub or gallop, no edema, right chest Port-A-Cath covered and EMLA cream with an occlusive dressing Breasts:  Deferred GI: Soft, not distended, non-tender, hypoactive bowel sounds, no organomegaly Skin: No rashes/lesions, skin warm and dry, no erythematous areas, no cyanosis  M/S:  Atraumatic, normal strength in all extremities, normal range of motion, no clubbing  Lymph nodes: Cervical, supraclavicular, and axillary nodes normal Neurologic: Grossly normal,  cranial nerves II through XII intact, alert and oriented x 3 Psych: Appropriate affect/anxious at times   LABORATORY DATA: Lab Results  Component Value Date   WBC 13.2* 05/25/2013   HGB 11.8 05/25/2013   HCT 36.2 05/25/2013   MCV 88.5 05/25/2013   PLT 253 05/25/2013      Chemistry      Component Value Date/Time   NA 142 05/25/2013 0929   NA 140 04/07/2013 1058   K 4.4 05/25/2013 0929   K 4.0 04/07/2013 1058   CL 106 04/07/2013 1058   CO2 23 05/25/2013 0929   CO2 24 04/07/2013 1058   BUN 13.9 05/25/2013 0929   BUN 10 04/07/2013 1058   CREATININE 0.7 05/25/2013 0929   CREATININE 0.66 04/07/2013 1058      Component Value Date/Time   CALCIUM 9.6 05/25/2013 0929   CALCIUM 9.2 04/07/2013 1058   ALKPHOS 59 05/25/2013 0929   AST 14 05/25/2013 0929   ALT 12 05/25/2013 0929    BILITOT 0.35 05/25/2013 0929       RADIOGRAPHIC STUDIES: No results found.      ASSESSMENT: Amanda Coleman is a 47 y.o. female with:  #1  For 3-4 months experienced a left nipple bloody discharge. She had a recent mammogram that did not show a mass. She had a physical examination which did show a mass measuring 3.4 cm at the 7 o'clock position. MRI on 03/24/2013 revealed the mass to be 5.0 cm. She went on to have a biopsy performed at the 7 o'clock position that showed invasive ductal carcinoma grade 1. Prognostic markers revealed the tumor to be estrogen receptor positive, progesterone receptor positive, HER-2/neu negative with a low Ki-67 11%.  #2  Patient is now status post mastectomy as of 04/13/2013. The final pathology did reveal invasive ductal carcinoma with ductal carcinoma in situ. Tumor is ER positive. We sent her tumor for Oncotype testing, but insufficient amount of tumor specimen meant testing could not be performed.  Patient's Bonneau was discussed again at the multidisciplinary breast conference. It was recommended by the team because there was significant amount of invasive disease that she should get chemotherapy.  Dr. Welton Flakes then discussed with the patient and her husband role of chemotherapy.  #3  Adjuvant chemotherapy consisting of every 3 week Taxotere/Cytoxan x 4 cycles with granulocyte support with Neulasta injection on day 2 of each cycle.   PLAN:  #1 Amanda Coleman will proceed today for cycle #1 of adjuvant chemotherapy consisting of every three-week Taxotere/Cytoxan with Neulasta injection scheduled for tomorrow.  We once again reviewed anti-nausea medications and tips to avoid discomfort from receiving Neulasta injection.  She and her husband voiced understanding.  #2 We plan to see her again on 06/15/2013 for an office visit/toxicity assessment/physical examination/laboratory evaluation and for chemotherapy cycle #2.  I will asked Dr. Milta Deiters RN to call Amanda Coleman next week to  see how she is feeling after chemotherapy cycle 1 and to assess if she needs an office visit sooner.  All questions answered.  Mr. and Mrs. Gero were encouraged to contact us in the interim with any questions, concerns, or problems.   Larina Bras, NP-C 05/27/2013, 7:29 PM

## 2013-05-25 NOTE — Telephone Encounter (Signed)
Per staff message and POF I have scheduled appts.  JMW  

## 2013-05-25 NOTE — Patient Instructions (Signed)
Please contact us at [phone] if you have any questions or concerns.  Please continue to do well and enjoy life!!!  Get plenty of rest, drink plenty of water, exercise daily (walking), eat a balanced diet.  Take Vitamin D3 1000 IUs daily.   Results for orders placed in visit on 05/25/13 (from the past 24 hour(s))  CBC WITH DIFFERENTIAL     Status: Abnormal   Collection Time    05/25/13  9:28 AM      Result Value Range   WBC 13.2 (*) 3.9 - 10.3 10e3/uL   NEUT# 11.0 (*) 1.5 - 6.5 10e3/uL   HGB 11.8  11.6 - 15.9 g/dL   HCT 16.1  09.6 - 04.5 %   Platelets 253  145 - 400 10e3/uL   MCV 88.5  79.5 - 101.0 fL   MCH 28.9  25.1 - 34.0 pg   MCHC 32.7  31.5 - 36.0 g/dL   RBC 4.09  8.11 - 9.14 10e6/uL   RDW 13.1  11.2 - 14.5 %   lymph# 1.4  0.9 - 3.3 10e3/uL   MONO# 0.8  0.1 - 0.9 10e3/uL   Eosinophils Absolute 0.0  0.0 - 0.5 10e3/uL   Basophils Absolute 0.0  0.0 - 0.1 10e3/uL   NEUT% 82.9 (*) 38.4 - 76.8 %   LYMPH% 10.8 (*) 14.0 - 49.7 %   MONO% 6.0  0.0 - 14.0 %   EOS% 0.0  0.0 - 7.0 %   BASO% 0.3  0.0 - 2.0 %   Narrative:    This order was split into 3orders: (CBC & Diff), (Comprehensive Metabolic Panel ), ( Collected by RN )Performed At:  [facility]               [address]               [place], [place] [address]  COMPREHENSIVE METABOLIC PANEL (CC13)     Status: Abnormal   Collection Time    05/25/13  9:29 AM      Result Value Range   Sodium 142  136 - 145 mEq/L   Potassium 4.4  3.5 - 5.1 mEq/L   Chloride 110 (*) 98 - 109 mEq/L   CO2 23  22 - 29 mEq/L   Glucose 87  70 - 140 mg/dl   BUN 62.1  7.0 - 30.8 mg/dL   Creatinine 0.7  0.6 - 1.1 mg/dL   Total Bilirubin 6.57  0.20 - 1.20 mg/dL   Alkaline Phosphatase 59  40 - 150 U/L   AST 14  5 - 34 U/L   ALT 12  0 - 55 U/L   Total Protein 7.3  6.4 - 8.3 g/dL   Albumin 4.0  3.5 - 5.0 g/dL   Calcium 9.6  8.4 - 84.6 mg/dL   Anion Gap 9  3 - 11 mEq/L   Narrative:    Performed At:  [facility]                [address]               [place], [place] [address]

## 2013-05-25 NOTE — Patient Instructions (Signed)
[facility] Discharge Instructions for Patients Receiving Chemotherapy  Today you received the following chemotherapy agents Taxotere,Cytoxan To help prevent nausea and vomiting after your treatment, we encourage you to take your nausea medication as prescribed.  If you develop nausea and vomiting that is not controlled by your nausea medication, call the clinic.   BELOW ARE SYMPTOMS THAT SHOULD BE REPORTED IMMEDIATELY:  *FEVER GREATER THAN 100.5 F  *CHILLS WITH OR WITHOUT FEVER  NAUSEA AND VOMITING THAT IS NOT CONTROLLED WITH YOUR NAUSEA MEDICATION  *UNUSUAL SHORTNESS OF BREATH  *UNUSUAL BRUISING OR BLEEDING  TENDERNESS IN MOUTH AND THROAT WITH OR WITHOUT PRESENCE OF ULCERS  *URINARY PROBLEMS  *BOWEL PROBLEMS  UNUSUAL RASH Items with * indicate a potential emergency and should be followed up as soon as possible.  Feel free to call the clinic you have any questions or concerns. The clinic phone number is [phone].    

## 2013-05-25 NOTE — Telephone Encounter (Signed)
, °

## 2013-05-26 ENCOUNTER — Telehealth: Payer: Self-pay | Admitting: *Deleted

## 2013-05-26 ENCOUNTER — Telehealth: Payer: Self-pay

## 2013-05-26 ENCOUNTER — Ambulatory Visit (HOSPITAL_BASED_OUTPATIENT_CLINIC_OR_DEPARTMENT_OTHER): Payer: BC Managed Care – PPO

## 2013-05-26 VITALS — BP 142/76 | HR 71 | Temp 98.7°F

## 2013-05-26 DIAGNOSIS — Z5189 Encounter for other specified aftercare: Secondary | ICD-10-CM

## 2013-05-26 DIAGNOSIS — C50512 Malignant neoplasm of lower-outer quadrant of left female breast: Secondary | ICD-10-CM

## 2013-05-26 DIAGNOSIS — C50519 Malignant neoplasm of lower-outer quadrant of unspecified female breast: Secondary | ICD-10-CM

## 2013-05-26 MED ORDER — PEGFILGRASTIM INJECTION 6 MG/0.6ML
6.0000 mg | Freq: Once | SUBCUTANEOUS | Status: AC
  Administered 2013-05-26: 6 mg via SUBCUTANEOUS
  Filled 2013-05-26: qty 0.6

## 2013-05-26 NOTE — Telephone Encounter (Signed)
Debhora here for Neulasta injection following 1st taxot/cytoxin chemotherapy.  States that she is doing great.  No nausea, vomiting, or diarrhea.  Is drinking and eating without problems.  All questions answered.  Knows to call if she has any problems or concerns.

## 2013-05-26 NOTE — Telephone Encounter (Signed)
Message copied by Lorine Bears on Fri May 26, 2013 12:19 PM ------      Message from: Yetta Glassman H      Created: Thu May 25, 2013 12:01 PM      Regarding: chemo follow-up call      Contact: [phone]       Coming from [place].  taxotere and cytoxan ------

## 2013-05-26 NOTE — Patient Instructions (Signed)

## 2013-05-26 NOTE — Telephone Encounter (Signed)
Left message for patient that this nurse was calling to follow up on her first treatment of chemotherapy yesterday as noted below. Instructed patient to call [phone] if any problems or questions arise.

## 2013-05-29 ENCOUNTER — Telehealth: Payer: Self-pay | Admitting: Medical Oncology

## 2013-05-29 NOTE — Telephone Encounter (Signed)
F/u call with patient regarding first chemo tx 05/25/13. Patient denies questions and states she felt everything went well, denies nausea or any other symptoms. Advised patient to call should she have any questions or concerns. Patient expressed thanks.

## 2013-05-31 ENCOUNTER — Telehealth: Payer: Self-pay | Admitting: *Deleted

## 2013-05-31 DIAGNOSIS — R350 Frequency of micturition: Secondary | ICD-10-CM

## 2013-05-31 DIAGNOSIS — C50512 Malignant neoplasm of lower-outer quadrant of left female breast: Secondary | ICD-10-CM

## 2013-05-31 NOTE — Telephone Encounter (Signed)
Verbal order received and read back from Dr. Welton Flakes for patient to come in for cbc, cmet, UA and f/u tomorrow at 8:30 am.  Order given to Patient at this time.  However she says she can't come in tomorrow.  Lives in [place].  Has appointments in [place] on Friday.  Better for her to come in early 06-02-2013 to make her 9:30 am appointment with plastic surgeon or come in here at 1:00 pm or later after her 11:50 am appointment with Dr. Dwain Sarna.  Will notify providers.  Ms. Abts asked if she can see Annice Pih on Friday if Dr. Welton Flakes does not have open appointments.

## 2013-05-31 NOTE — Telephone Encounter (Signed)
Patient called asking if what she is feeling is normal.  First Taxotere, Cytoxan May 25, 2013 with neulasta support.    Felt different all weekend but today a shift with more pain to sternum and lower back.  Denies any activity to hurt these areas.  Has taken ES Tylenol twice today with minimal relief.  Feels a dull ache that's pulling and different than what she's felt since the mastectomy on 05-14-2013.    Also reports stomach feels like she has to go .  Has had two bm's in the past thirty minutes that are normal consistency just burning and urgency.  Ask if she can take tums.  Steroids completed and did take these with food.  Eating soft noodles and broccoli.  Reports her mouth feels like it's forming a coating.  Denies sores or pain with eating/swallowing.  Using biotene products.  Uses [facility] in [place], [place]. [phone].  Suggested she FF, try BRAT diet and perhaps cool foods like ice cream to try to settle stomach and stay hydrated.  Discussed mouth care and gargles to try.   Informed her I will notify providers and at this time it may be tomorrow before any orders if needed.

## 2013-06-01 ENCOUNTER — Encounter: Payer: Self-pay | Admitting: *Deleted

## 2013-06-01 ENCOUNTER — Telehealth: Payer: Self-pay | Admitting: Oncology

## 2013-06-01 ENCOUNTER — Telehealth: Payer: Self-pay | Admitting: *Deleted

## 2013-06-01 DIAGNOSIS — C50512 Malignant neoplasm of lower-outer quadrant of left female breast: Secondary | ICD-10-CM

## 2013-06-01 NOTE — Telephone Encounter (Signed)
Pt scheduled for nadir check with Dr. Welton Flakes.  Confirmed appt date and time.  Pt denies further needs.  Gave emotional support and reassurance.

## 2013-06-01 NOTE — Addendum Note (Signed)
Addended by: Laroy Apple E on: 06/01/2013 01:23 PM   Modules accepted: Orders

## 2013-06-01 NOTE — Telephone Encounter (Signed)
Discussed with Dr Welton Flakes, ok for patient to come in for lab/Jackie with further assessment of these issues on Friday 10/17, will have scheduling call patient.

## 2013-06-01 NOTE — Telephone Encounter (Signed)
, °

## 2013-06-02 ENCOUNTER — Encounter: Payer: Self-pay | Admitting: Family

## 2013-06-02 ENCOUNTER — Ambulatory Visit: Payer: BC Managed Care – PPO | Admitting: Family

## 2013-06-02 ENCOUNTER — Telehealth: Payer: Self-pay | Admitting: Oncology

## 2013-06-02 ENCOUNTER — Ambulatory Visit (INDEPENDENT_AMBULATORY_CARE_PROVIDER_SITE_OTHER): Payer: BC Managed Care – PPO | Admitting: General Surgery

## 2013-06-02 ENCOUNTER — Ambulatory Visit (HOSPITAL_BASED_OUTPATIENT_CLINIC_OR_DEPARTMENT_OTHER): Payer: BC Managed Care – PPO | Admitting: Family

## 2013-06-02 ENCOUNTER — Other Ambulatory Visit: Payer: BC Managed Care – PPO | Admitting: Lab

## 2013-06-02 ENCOUNTER — Other Ambulatory Visit (HOSPITAL_BASED_OUTPATIENT_CLINIC_OR_DEPARTMENT_OTHER): Payer: BC Managed Care – PPO

## 2013-06-02 ENCOUNTER — Ambulatory Visit: Payer: BC Managed Care – PPO | Admitting: Oncology

## 2013-06-02 ENCOUNTER — Encounter (INDEPENDENT_AMBULATORY_CARE_PROVIDER_SITE_OTHER): Payer: Self-pay | Admitting: General Surgery

## 2013-06-02 VITALS — BP 137/78 | HR 75 | Temp 98.4°F | Resp 20 | Ht 65.0 in | Wt 143.6 lb

## 2013-06-02 VITALS — BP 128/76 | HR 68 | Temp 97.4°F | Resp 14 | Ht 65.0 in | Wt 139.0 lb

## 2013-06-02 DIAGNOSIS — C50512 Malignant neoplasm of lower-outer quadrant of left female breast: Secondary | ICD-10-CM

## 2013-06-02 DIAGNOSIS — Z09 Encounter for follow-up examination after completed treatment for conditions other than malignant neoplasm: Secondary | ICD-10-CM

## 2013-06-02 DIAGNOSIS — C50919 Malignant neoplasm of unspecified site of unspecified female breast: Secondary | ICD-10-CM

## 2013-06-02 DIAGNOSIS — R350 Frequency of micturition: Secondary | ICD-10-CM

## 2013-06-02 DIAGNOSIS — Z170 Estrogen receptor positive status [ER+]: Secondary | ICD-10-CM

## 2013-06-02 LAB — CBC WITH DIFFERENTIAL/PLATELET
BASO%: 0.2 % (ref 0.0–2.0)
EOS%: 0.1 % (ref 0.0–7.0)
HCT: 35 % (ref 34.8–46.6)
HGB: 11.1 g/dL — ABNORMAL LOW (ref 11.6–15.9)
MCH: 28 pg (ref 25.1–34.0)
MCHC: 31.8 g/dL (ref 31.5–36.0)
MCV: 87.9 fL (ref 79.5–101.0)
MONO#: 1 10*3/uL — ABNORMAL HIGH (ref 0.1–0.9)
NEUT%: 88.1 % — ABNORMAL HIGH (ref 38.4–76.8)
Platelets: 152 10*3/uL (ref 145–400)
RDW: 13.3 % (ref 11.2–14.5)
WBC: 41.4 10*3/uL — ABNORMAL HIGH (ref 3.9–10.3)
lymph#: 3.8 10*3/uL — ABNORMAL HIGH (ref 0.9–3.3)

## 2013-06-02 LAB — URINALYSIS, MICROSCOPIC - CHCC
Leukocyte Esterase: NEGATIVE
Nitrite: NEGATIVE
Protein: NEGATIVE mg/dL
Urobilinogen, UR: 0.2 mg/dL (ref 0.2–1)
pH: 5 (ref 4.6–8.0)

## 2013-06-02 LAB — COMPREHENSIVE METABOLIC PANEL (CC13)
ALT: 27 U/L (ref 0–55)
AST: 33 U/L (ref 5–34)
Albumin: 3.4 g/dL — ABNORMAL LOW (ref 3.5–5.0)
Anion Gap: 8 mEq/L (ref 3–11)
CO2: 25 mEq/L (ref 22–29)
Calcium: 8.9 mg/dL (ref 8.4–10.4)
Chloride: 108 mEq/L (ref 98–109)
Creatinine: 0.7 mg/dL (ref 0.6–1.1)
Potassium: 4.4 mEq/L (ref 3.5–5.1)
Total Protein: 6.1 g/dL — ABNORMAL LOW (ref 6.4–8.3)

## 2013-06-02 NOTE — Patient Instructions (Signed)
Please contact us at [phone] if you have any questions or concerns.  Please continue to do well and enjoy life!!!  Get plenty of rest, drink plenty of water, exercise daily (walking), eat a balanced diet.  Results for orders placed in visit on 06/02/13 (from the past 24 hour(s))  CBC WITH DIFFERENTIAL     Status: Abnormal   Collection Time    06/02/13 12:25 PM      Result Value Range   WBC 41.4 (*) 3.9 - 10.3 10e3/uL   NEUT# 36.5 (*) 1.5 - 6.5 10e3/uL   HGB 11.1 (*) 11.6 - 15.9 g/dL   HCT 11.9  14.7 - 82.9 %   Platelets 152  145 - 400 10e3/uL   MCV 87.9  79.5 - 101.0 fL   MCH 28.0  25.1 - 34.0 pg   MCHC 31.8  31.5 - 36.0 g/dL   RBC 5.62  1.30 - 8.65 10e6/uL   RDW 13.3  11.2 - 14.5 %   lymph# 3.8 (*) 0.9 - 3.3 10e3/uL   MONO# 1.0 (*) 0.1 - 0.9 10e3/uL   Eosinophils Absolute 0.1  0.0 - 0.5 10e3/uL   Basophils Absolute 0.1  0.0 - 0.1 10e3/uL   NEUT% 88.1 (*) 38.4 - 76.8 %   LYMPH% 9.1 (*) 14.0 - 49.7 %   MONO% 2.5  0.0 - 14.0 %   EOS% 0.1  0.0 - 7.0 %   BASO% 0.2  0.0 - 2.0 %   Narrative:    Performed At:  [facility]               [address]               [place], [place] [address]  COMPREHENSIVE METABOLIC PANEL (CC13)     Status: Abnormal   Collection Time    06/02/13 12:25 PM      Result Value Range   Sodium 141  136 - 145 mEq/L   Potassium 4.4  3.5 - 5.1 mEq/L   Chloride 108  98 - 109 mEq/L   CO2 25  22 - 29 mEq/L   Glucose 108  70 - 140 mg/dl   BUN 62.9  7.0 - 52.8 mg/dL   Creatinine 0.7  0.6 - 1.1 mg/dL   Total Bilirubin <4.13  0.20 - 1.20 mg/dL   Alkaline Phosphatase 86  40 - 150 U/L   AST 33  5 - 34 U/L   ALT 27  0 - 55 U/L   Total Protein 6.1 (*) 6.4 - 8.3 g/dL   Albumin 3.4 (*) 3.5 - 5.0 g/dL   Calcium 8.9  8.4 - 24.4 mg/dL   Anion Gap 8  3 - 11 mEq/L   Narrative:    Performed At:  [facility]               [address]               [place], [place] [address]  URINALYSIS, MICROSCOPIC - [facility]     Status: None   Collection  Time    06/02/13 12:25 PM      Result Value Range   Glucose Negative  Negative mg/dL   Bilirubin (Urine) Negative  Negative   Ketones Negative  Negative mg/dL   Specific Gravity, Urine 1.015  1.003 - 1.035   Blood Trace  Negative   pH 5.0  4.6 - 8.0   Protein Negative  Negative- <30 mg/dL  Urobilinogen, UR 0.2  0.2 - 1 mg/dL   Nitrite Negative  Negative   Leukocyte Esterase Negative  Negative   RBC / HPF 0-2  0 - 2   WBC, UA 0-2  0 - 2   Bacteria, UA Few  Negative- Trace   Epithelial Cells Few  Negative- Few   Mucus, UA Small  Negative- Small   Narrative:    Performed At:  [facility]               [address]               [place], [place] [address]

## 2013-06-02 NOTE — Progress Notes (Addendum)
[facility]  Telephone:([phone] [phone]  OFFICE PROGRESS NOTE   ID: Amanda Coleman   DOB: February 27, 1966  MR#: [id number]  JYN#:[id number]   PCP: Freddy Finner, MD RAD ONC: Lonie Peak, M.D.  SU: Emelia Loron, M.D. SUAlan Ripper Sanger, D.O.   DIAGNOSIS: Amanda Coleman is a 47 y.o. 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 Patient was seen in the multidisciplinary clinic on 03/29/2013.   STAGE:  Breast cancer of lower-outer quadrant of left female breast  Primary site: Breast (Left)  Staging method: AJCC 7th Edition  Clinical: Stage IIA (T2, N0, cM0)  Summary: Stage IIA (T2, N0, cM0)   PRIOR THERAPY: #1  For 3-4 months experienced a left nipple bloody discharge. She had a recent mammogram that did not show a mass. She had a physical examination which did show a mass measuring 3.4 cm at the 7 o'clock position. MRI on 03/24/2013 revealed the mass to be 5.0 cm. She went on to have a biopsy performed at the 7 o'clock position that showed invasive ductal carcinoma grade 1. Prognostic markers revealed the tumor to be estrogen receptor positive, progesterone receptor positive, HER-2/neu negative with a low Ki-67 11%.  #2  Patient is now status post mastectomy as of 04/13/2013. The final pathology did reveal invasive ductal carcinoma with ductal carcinoma in situ. Tumor is ER positive. We sent her tumor for Oncotype testing, but insufficient amount of tumor specimen meant testing could not be performed.  Patient's Schriver was discussed again at the multidisciplinary breast conference. It was recommended by the team because there was significant amount of invasive disease that she should get chemotherapy.  Dr. Welton Flakes then discussed with the patient and her husband role of chemotherapy.  #3 Dr. Welton Flakes and the patient discussed different chemotherapy options and decided upon Taxotere Cytoxan to be given every 3 weeks for a total of  4 cycles.   CURRENT THERAPY: Adjuvant chemotherapy consisting of every 3 week Taxotere/Cytoxan x 4 cycles with granulocyte support with Neulasta injection on day 2 of each cycle.    INTERVAL HISTORY: Amanda Coleman 47 y.o. female returns for followup visit after receiving first cycle of adjuvant chemotherapy consisting of every three-week Taxotere/Cytoxan.  She is accompanied for today's office visit by her husband [name].  Her interval history is significant for experiencing the sternum and lower back pain after receiving Neulasta injection, diarrhea for one day, having a "unsettled feeling" in her stomach, and feeling like her tongue was "coated", alteration of taste, night sweats, and insomnia the first 5 nights after chemotherapy even when she took Ambien or Xanax.  She received her first chemotherapy cycle on 05/25/2013, but did not start experiencing any symptomatology until 2 days ago/05/31/2013.  Her symptoms have resolved as of today, but states the prior 2 days have been "rough."   Her interval history is otherwise stable and unremarkable.   MEDICAL HISTORY: Past Medical History  Diagnosis Date  . Thyroid disease   . Breast cancer   . Hashimoto's disease   . Hypothyroidism   . PONV (postoperative nausea and vomiting)   . GERD (gastroesophageal reflux disease)     ALLERGIES:   Allergies  Allergen Reactions  . Penicillins Swelling  . Sulfa Antibiotics Rash    MEDICATIONS:  Current Outpatient Prescriptions  Medication Sig Dispense Refill  . acetaminophen (TYLENOL) 500 MG tablet Take 500 mg by mouth every 6 (six) hours as needed for pain.      Marland Kitchen  ALPRAZolam (XANAX) 0.5 MG tablet Take 0.5 mg by mouth as needed for sleep.      Marland Kitchen dexamethasone (DECADRON) 4 MG tablet Take 2 tablets (8mg  total) 2 times a day the day before Taxotere, then take 2 times a day starting the day after chemo for 3 days.  30 tablet  1  . diazepam (VALIUM) 2 MG tablet Take 2 mg by mouth daily as needed (for  muscle spasms).       Marland Kitchen levothyroxine (SYNTHROID, LEVOTHROID) 112 MCG tablet Take 112 mcg by mouth daily before breakfast.      . lidocaine-prilocaine (EMLA) cream Apply topically as needed. Apply 1-2 hours to port-a-cath site prior to infusion appointment.  30 g  3  . LORazepam (ATIVAN) 0.5 MG tablet Take 0.5 mg by mouth every 6 (six) hours as needed for anxiety (For nausea or vomiting).       . ondansetron (ZOFRAN) 8 MG tablet Take 2 times a day starting the day after chemo for 3 days, then take 2 times a day as needed for nausea and vomiting  30 tablet  1  . prochlorperazine (COMPAZINE) 10 MG tablet Take 1 tablet (10 mg total) by mouth every 6 (six) hours as needed.  30 tablet  1  . zolpidem (AMBIEN) 10 MG tablet Take 10 mg by mouth at bedtime as needed for sleep.      . promethazine (PHENERGAN) 12.5 MG tablet        No current facility-administered medications for this visit.    SURGICAL HISTORY:  Past Surgical History  Procedure Laterality Date  . Abdominal hysterectomy    . Gum graft    . Simple mastectomy with axillary sentinel node biopsy Left 04/13/2013    Dr Dwain Sarna  . Mastectomy w/ sentinel node biopsy Left 04/13/2013    Procedure: LEFT SKIN SPARING MASTECTOMY WITH LEFT AXILLARY SENTINEL NODE BIOPSY;  Surgeon: Emelia Loron, MD;  Location: [facility] OR;  Service: General;  Laterality: Left;  . Breast reconstruction with placement of tissue expander and flex hd (acellular hydrated dermis) Left 04/13/2013    Procedure: LEFT BREAST RECONSTRUCTION WITH PLACEMENT OF TISSUE EXPANDER AND FLEX HD (ACELLULAR HYDRATED DERMIS);  Surgeon: Wayland Denis, DO;  Location: [facility] OR;  Service: Plastics;  Laterality: Left;  . Portacath placement N/A 05/18/2013    Procedure: INSERTION PORT-A-CATH;  Surgeon: Emelia Loron, MD;  Location: [facility] ORS;  Service: General;  Laterality: N/A;    REVIEW OF SYSTEMS:  Pertinent items are noted in HPI.   A 10 point review of systems was completed and is negative  except as noted above.  Amanda Coleman denies any other symptomatology including fever or chills, headache, vision changes, swollen glands, cough or shortness of breath, chest pain or discomfort, vomiting,  constipation, change in urinary habits, any other arthralgias/myalgias, unusual bleeding/bruising or any other symptomatology.   PHYSICAL EXAMINATION: Blood pressure 137/78, pulse 75, temperature 98.4 F (36.9 C), temperature source Oral, resp. rate 20, height 5\' 5"  (1.651 m), weight 143 lb 9.6 oz (65.137 kg). Body mass index is 23.9 kg/(m^2).  ECOG PERFORMANCE STATUS: 1 - Symptomatic but completely ambulatory  General appearance: Alert, cooperative, well nourished, thin frame, no apparent distress Head: Normocephalic, without obvious abnormality, atraumatic Eyes: Conjunctivae/corneas clear, PERRLA, EOMI Nose: Nares, septum and mucosa are normal, no drainage or sinus tenderness Neck: No adenopathy, supple, symmetrical, trachea midline, no tenderness Resp: Clear to auscultation bilaterally, no wheezes/rales/rhonchi Cardio: Regular rate and rhythm, S1, S2 normal, no murmur, click, rub or  gallop, no edema, right chest Port-A-Cath without signs of infection Breasts:  Deferred GI: Soft, not distended, non-tender, hypoactive bowel sounds, no organomegaly Skin: No rashes/lesions, skin warm and dry, no erythematous areas, no cyanosis  M/S:  Atraumatic, normal strength in all extremities, normal range of motion, no clubbing  Lymph nodes: Cervical, supraclavicular, and axillary nodes normal Neurologic: Grossly normal, cranial nerves II through XII intact, alert and oriented x 3 Psych: Appropriate affect/anxious at times   LABORATORY DATA: Lab Results  Component Value Date   WBC 41.4* 06/02/2013   HGB 11.1* 06/02/2013   HCT 35.0 06/02/2013   MCV 87.9 06/02/2013   PLT 152 06/02/2013      Chemistry      Component Value Date/Time   NA 141 06/02/2013 1225   NA 140 04/07/2013 1058   K 4.4  06/02/2013 1225   K 4.0 04/07/2013 1058   CL 106 04/07/2013 1058   CO2 25 06/02/2013 1225   CO2 24 04/07/2013 1058   BUN 13.4 06/02/2013 1225   BUN 10 04/07/2013 1058   CREATININE 0.7 06/02/2013 1225   CREATININE 0.66 04/07/2013 1058      Component Value Date/Time   CALCIUM 8.9 06/02/2013 1225   CALCIUM 9.2 04/07/2013 1058   ALKPHOS 86 06/02/2013 1225   AST 33 06/02/2013 1225   ALT 27 06/02/2013 1225   BILITOT <0.20 06/02/2013 1225       RADIOGRAPHIC STUDIES: No results found.      ASSESSMENT: Amanda Coleman is a 47 y.o. female with:  #1  For 3-4 months experienced a left nipple bloody discharge. She had a recent mammogram that did not show a mass. She had a physical examination which did show a mass measuring 3.4 cm at the 7 o'clock position. MRI on 03/24/2013 revealed the mass to be 5.0 cm. She went on to have a biopsy performed at the 7 o'clock position that showed invasive ductal carcinoma grade 1. Prognostic markers revealed the tumor to be estrogen receptor positive, progesterone receptor positive, HER-2/neu negative with a low Ki-67 11%.  #2  Patient is now status post mastectomy as of 04/13/2013. The final pathology did reveal invasive ductal carcinoma with ductal carcinoma in situ. Tumor is ER positive. We sent her tumor for Oncotype testing, but insufficient amount of tumor specimen meant testing could not be performed.  Patient's Melaragno was discussed again at the multidisciplinary breast conference. It was recommended by the team because there was significant amount of invasive disease that she should get chemotherapy.  Dr. Welton Flakes then discussed with the patient and her husband role of chemotherapy.  #3  Adjuvant chemotherapy consisting of every 3 week Taxotere/Cytoxan x 4 cycles with granulocyte support with Neulasta injection on day 2 of each cycle.  #4  "Unsettled feeling"in her stomach.   PLAN:  #1 Amanda Coleman received cycle #1 of adjuvant chemotherapy consisting of every  three-week Taxotere/Cytoxan with Neulasta injection on 05/25/2013.  We once again reviewed anti-nausea medications and tips to avoid discomfort from receiving Neulasta injection.  She and her husband voiced understanding.  Prilosec was recommended for the patient's unsettled feeling in her stomach.  She declined a prescription for Protonix and stated she has Prilosec at home.  #2 We plan to see her again on 06/15/2013 for an office visit/toxicity assessment/physical examination/laboratory evaluation for chemotherapy cycle #2.   All questions answered.  Mr. and Amanda Coleman were encouraged to contact us in the interim with any questions, concerns, or problems.  Larina Bras, NP-C 06/03/2013, 5:03 PM    ATTENDING'S ATTESTATION:  I personally reviewed patient's chart, examined patient myself, formulated the treatment plan as followed.    Patient is status post cycle 1 of Taxotere and Cytoxan. Course complicated by development of some diarrhea. This is now resolved. She has no nausea or vomiting. She does feel that she is doing well. She'll be seen back in 2 weeks' time for cycle #2 of TC.  Drue Second, MD Medical/Oncology [facility] [phone] (beeper) [phone] (Office)

## 2013-06-02 NOTE — Telephone Encounter (Signed)
, °

## 2013-06-02 NOTE — Progress Notes (Signed)
Subjective:     Patient ID: Amanda Coleman, female   DOB: 1966-07-14, 47 y.o.   MRN: [id number]  HPI [name] who recently underwent a left simple mastectomy and sentinel node biopsy for a stage II breast cancer. Her DCIS is probably less than 1 mm from the anterior soft tissue resection margin but this is only skin.  I placed a port recently and she has begun her chemotherapy.  She is tolerating this well so far and is following up with plastic surgery.   Review of Systems     Objective:   Physical Exam Healing port site without infection    Assessment:     S/p port placement Stage II left breast cancer     Plan:     She is doing well without complaints today.  I will see back in 6 months.

## 2013-06-05 ENCOUNTER — Ambulatory Visit: Payer: BC Managed Care – PPO | Admitting: Oncology

## 2013-06-12 ENCOUNTER — Encounter: Payer: Self-pay | Admitting: *Deleted

## 2013-06-12 NOTE — Progress Notes (Signed)
Mailed after appt letter to pt. 

## 2013-06-15 ENCOUNTER — Other Ambulatory Visit: Payer: BC Managed Care – PPO | Admitting: Lab

## 2013-06-15 ENCOUNTER — Telehealth: Payer: Self-pay | Admitting: Oncology

## 2013-06-15 ENCOUNTER — Ambulatory Visit (HOSPITAL_BASED_OUTPATIENT_CLINIC_OR_DEPARTMENT_OTHER): Payer: BC Managed Care – PPO

## 2013-06-15 ENCOUNTER — Encounter: Payer: Self-pay | Admitting: Oncology

## 2013-06-15 ENCOUNTER — Other Ambulatory Visit (HOSPITAL_BASED_OUTPATIENT_CLINIC_OR_DEPARTMENT_OTHER): Payer: BC Managed Care – PPO | Admitting: Lab

## 2013-06-15 ENCOUNTER — Ambulatory Visit (HOSPITAL_BASED_OUTPATIENT_CLINIC_OR_DEPARTMENT_OTHER): Payer: BC Managed Care – PPO | Admitting: Oncology

## 2013-06-15 VITALS — BP 130/81 | HR 67 | Temp 98.8°F | Resp 18 | Ht 65.0 in | Wt 143.6 lb

## 2013-06-15 DIAGNOSIS — Z5111 Encounter for antineoplastic chemotherapy: Secondary | ICD-10-CM

## 2013-06-15 DIAGNOSIS — C50319 Malignant neoplasm of lower-inner quadrant of unspecified female breast: Secondary | ICD-10-CM

## 2013-06-15 DIAGNOSIS — C50512 Malignant neoplasm of lower-outer quadrant of left female breast: Secondary | ICD-10-CM

## 2013-06-15 DIAGNOSIS — Z170 Estrogen receptor positive status [ER+]: Secondary | ICD-10-CM

## 2013-06-15 LAB — CBC WITH DIFFERENTIAL/PLATELET
BASO%: 0.4 % (ref 0.0–2.0)
Basophils Absolute: 0.1 10*3/uL (ref 0.0–0.1)
Eosinophils Absolute: 0 10*3/uL (ref 0.0–0.5)
HCT: 35.2 % (ref 34.8–46.6)
HGB: 11.6 g/dL (ref 11.6–15.9)
LYMPH%: 13.2 % — ABNORMAL LOW (ref 14.0–49.7)
MCH: 28.6 pg (ref 25.1–34.0)
MCV: 86.9 fL (ref 79.5–101.0)
MONO%: 8.5 % (ref 0.0–14.0)
NEUT#: 9.9 10*3/uL — ABNORMAL HIGH (ref 1.5–6.5)
NEUT%: 77.9 % — ABNORMAL HIGH (ref 38.4–76.8)
RBC: 4.05 10*6/uL (ref 3.70–5.45)
RDW: 13.8 % (ref 11.2–14.5)
WBC: 12.6 10*3/uL — ABNORMAL HIGH (ref 3.9–10.3)
nRBC: 0 % (ref 0–0)

## 2013-06-15 LAB — COMPREHENSIVE METABOLIC PANEL (CC13)
AST: 17 U/L (ref 5–34)
Albumin: 4 g/dL (ref 3.5–5.0)
Anion Gap: 10 mEq/L (ref 3–11)
BUN: 14.5 mg/dL (ref 7.0–26.0)
CO2: 22 mEq/L (ref 22–29)
Glucose: 91 mg/dl (ref 70–140)
Potassium: 3.7 mEq/L (ref 3.5–5.1)
Sodium: 141 mEq/L (ref 136–145)
Total Bilirubin: 0.46 mg/dL (ref 0.20–1.20)
Total Protein: 6.9 g/dL (ref 6.4–8.3)

## 2013-06-15 MED ORDER — DEXTROSE 5 % IV SOLN
75.0000 mg/m2 | Freq: Once | INTRAVENOUS | Status: AC
  Administered 2013-06-15: 130 mg via INTRAVENOUS
  Filled 2013-06-15: qty 13

## 2013-06-15 MED ORDER — ONDANSETRON 16 MG/50ML IVPB (CHCC)
16.0000 mg | Freq: Once | INTRAVENOUS | Status: AC
  Administered 2013-06-15: 16 mg via INTRAVENOUS

## 2013-06-15 MED ORDER — DEXAMETHASONE SODIUM PHOSPHATE 20 MG/5ML IJ SOLN
INTRAMUSCULAR | Status: AC
  Filled 2013-06-15: qty 5

## 2013-06-15 MED ORDER — SODIUM CHLORIDE 0.9 % IV SOLN
Freq: Once | INTRAVENOUS | Status: AC
  Administered 2013-06-15: 11:00:00 via INTRAVENOUS

## 2013-06-15 MED ORDER — ONDANSETRON 16 MG/50ML IVPB (CHCC)
INTRAVENOUS | Status: AC
  Filled 2013-06-15: qty 16

## 2013-06-15 MED ORDER — HEPARIN SOD (PORK) LOCK FLUSH 100 UNIT/ML IV SOLN
500.0000 [IU] | Freq: Once | INTRAVENOUS | Status: AC | PRN
  Administered 2013-06-15: 500 [IU]
  Filled 2013-06-15: qty 5

## 2013-06-15 MED ORDER — DEXAMETHASONE SODIUM PHOSPHATE 20 MG/5ML IJ SOLN
20.0000 mg | Freq: Once | INTRAMUSCULAR | Status: AC
  Administered 2013-06-15: 20 mg via INTRAVENOUS

## 2013-06-15 MED ORDER — SODIUM CHLORIDE 0.9 % IJ SOLN
10.0000 mL | INTRAMUSCULAR | Status: DC | PRN
  Administered 2013-06-15: 10 mL
  Filled 2013-06-15: qty 10

## 2013-06-15 MED ORDER — SODIUM CHLORIDE 0.9 % IV SOLN
600.0000 mg/m2 | Freq: Once | INTRAVENOUS | Status: AC
  Administered 2013-06-15: 1060 mg via INTRAVENOUS
  Filled 2013-06-15: qty 53

## 2013-06-15 NOTE — Patient Instructions (Signed)
[facility] Discharge Instructions for Patients Receiving Chemotherapy  Today you received the following chemotherapy agents:  Taxotere, cytoxan  To help prevent nausea and vomiting after your treatment, we encourage you to take your nausea medication.  Take it as often as prescribed.     If you develop nausea and vomiting that is not controlled by your nausea medication, call the clinic. If it is after clinic hours your family physician or the after hours number for the clinic or go to the Emergency Department.   BELOW ARE SYMPTOMS THAT SHOULD BE REPORTED IMMEDIATELY:  *FEVER GREATER THAN 100.5 F  *CHILLS WITH OR WITHOUT FEVER  NAUSEA AND VOMITING THAT IS NOT CONTROLLED WITH YOUR NAUSEA MEDICATION  *UNUSUAL SHORTNESS OF BREATH  *UNUSUAL BRUISING OR BLEEDING  TENDERNESS IN MOUTH AND THROAT WITH OR WITHOUT PRESENCE OF ULCERS  *URINARY PROBLEMS  *BOWEL PROBLEMS  UNUSUAL RASH Items with * indicate a potential emergency and should be followed up as soon as possible.  Feel free to call the clinic you have any questions or concerns. The clinic phone number is [phone].   I have been informed and understand all the instructions given to me. I know to contact the clinic, my physician, or go to the Emergency Department if any problems should occur. I do not have any questions at this time, but understand that I may call the clinic during office hours   should I have any questions or need assistance in obtaining follow up care.    __________________________________________  _____________  __________ Signature of Patient or Authorized Representative            Date                   Time    __________________________________________ Nurse's Signature    

## 2013-06-15 NOTE — Progress Notes (Signed)
Chaplain made initial visit. Patient was in good spirits and reading a magazine. She stated that she was having her second treatment and that it was going very well so far. Patient said she felt grateful because she had a strong faith which has helped her so far and also an excellent support network. Chaplain practiced active listening and empathic presence while patient described how she was coping so well.

## 2013-06-15 NOTE — Progress Notes (Signed)
[facility]  Telephone:(336) [phone] Fax:(336) [phone]  OFFICE PROGRESS NOTE   ID: Amanda Coleman   DOB: 1966-08-08  MR#: [id number]  JYN#:[id number]   PCP: Freddy Finner, MD RAD ONC: Lonie Peak, M.D.  SU: Emelia Loron, M.D. SUAlan Ripper Sanger, D.O.   DIAGNOSIS: Amanda Coleman is a 47 y.o. 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 Patient was seen in the multidisciplinary clinic on 03/29/2013.   STAGE:  Breast cancer of lower-outer quadrant of left female breast  Primary site: Breast (Left)  Staging method: AJCC 7th Edition  Clinical: Stage IIA (T2, N0, cM0)  Summary: Stage IIA (T2, N0, cM0)   PRIOR THERAPY: #1  For 3-4 months experienced a left nipple bloody discharge. She had a recent mammogram that did not show a mass. She had a physical examination which did show a mass measuring 3.4 cm at the 7 o'clock position. MRI on 03/24/2013 revealed the mass to be 5.0 cm. She went on to have a biopsy performed at the 7 o'clock position that showed invasive ductal carcinoma grade 1. Prognostic markers revealed the tumor to be estrogen receptor positive, progesterone receptor positive, HER-2/neu negative with a low Ki-67 11%.  #2  Patient is now status post mastectomy as of 04/13/2013. The final pathology did reveal invasive ductal carcinoma with ductal carcinoma in situ. Tumor is ER positive. We sent her tumor for Oncotype testing, but insufficient amount of tumor specimen meant testing could not be performed.  Patient's Kaminski was discussed again at the multidisciplinary breast conference.  #3 patient is receiving Taxotere Cytoxan every 3 weeks for a total of 4 cycles.  CURRENT THERAPY: cycle 2 of TC   INTERVAL HISTORY: Amanda Coleman 47 y.o. female returns for followup visit. So far she has completed one cycle of Taxotere and Cytoxan starting 05/25/2013. Overall she tolerated it well. She is now here for cycle #2. Patient  did lose her hair and she is now wearing a cap. She has no nausea or vomiting today no fevers chills or night sweats. She is not experiencing any rashes or peripheral paresthesias. Remainder of the 10 point review of systems is negative.Marland Kitchen  MEDICAL HISTORY: Past Medical History  Diagnosis Date  . Thyroid disease   . Breast cancer   . Hashimoto's disease   . Hypothyroidism   . PONV (postoperative nausea and vomiting)   . GERD (gastroesophageal reflux disease)     ALLERGIES:   Allergies  Allergen Reactions  . Penicillins Swelling  . Sulfa Antibiotics Rash    MEDICATIONS:  Current Outpatient Prescriptions  Medication Sig Dispense Refill  . acetaminophen (TYLENOL) 500 MG tablet Take 500 mg by mouth every 6 (six) hours as needed for pain.      Marland Kitchen ALPRAZolam (XANAX) 0.5 MG tablet Take 0.5 mg by mouth as needed for sleep.      Marland Kitchen dexamethasone (DECADRON) 4 MG tablet Take 2 tablets (8mg  total) 2 times a day the day before Taxotere, then take 2 times a day starting the day after chemo for 3 days.  30 tablet  1  . diazepam (VALIUM) 2 MG tablet Take 2 mg by mouth daily as needed (for muscle spasms).       Marland Kitchen levothyroxine (SYNTHROID, LEVOTHROID) 112 MCG tablet Take 112 mcg by mouth daily before breakfast.      . lidocaine-prilocaine (EMLA) cream Apply topically as needed. Apply 1-2 hours to port-a-cath site prior to infusion appointment.  30 g  3  . LORazepam (ATIVAN) 0.5 MG tablet Take 0.5 mg by mouth every 6 (six) hours as needed for anxiety (For nausea or vomiting).       . ondansetron (ZOFRAN) 8 MG tablet Take 2 times a day starting the day after chemo for 3 days, then take 2 times a day as needed for nausea and vomiting  30 tablet  1  . prochlorperazine (COMPAZINE) 10 MG tablet Take 1 tablet (10 mg total) by mouth every 6 (six) hours as needed.  30 tablet  1  . zolpidem (AMBIEN) 10 MG tablet Take 10 mg by mouth at bedtime as needed for sleep.       No current facility-administered medications  for this visit.    SURGICAL HISTORY:  Past Surgical History  Procedure Laterality Date  . Abdominal hysterectomy    . Gum graft    . Simple mastectomy with axillary sentinel node biopsy Left 04/13/2013    Dr Dwain Sarna  . Mastectomy w/ sentinel node biopsy Left 04/13/2013    Procedure: LEFT SKIN SPARING MASTECTOMY WITH LEFT AXILLARY SENTINEL NODE BIOPSY;  Surgeon: Emelia Loron, MD;  Location: [facility] OR;  Service: General;  Laterality: Left;  . Breast reconstruction with placement of tissue expander and flex hd (acellular hydrated dermis) Left 04/13/2013    Procedure: LEFT BREAST RECONSTRUCTION WITH PLACEMENT OF TISSUE EXPANDER AND FLEX HD (ACELLULAR HYDRATED DERMIS);  Surgeon: Wayland Denis, DO;  Location: [facility] OR;  Service: Plastics;  Laterality: Left;  . Portacath placement N/A 05/18/2013    Procedure: INSERTION PORT-A-CATH;  Surgeon: Emelia Loron, MD;  Location: [facility] ORS;  Service: General;  Laterality: N/A;    REVIEW OF SYSTEMS:  Pertinent items are noted in HPI.   A 10 point review of systems was completed and is negative except as noted above.  Amanda Coleman denies any other symptomatology including fever or chills, headache, vision changes, swollen glands, cough or shortness of breath, chest pain or discomfort, vomiting,  constipation, change in urinary habits, any other arthralgias/myalgias, unusual bleeding/bruising or any other symptomatology.   PHYSICAL EXAMINATION: Blood pressure 130/81, pulse 67, temperature 98.8 F (37.1 C), temperature source Oral, resp. rate 18, height 5\' 5"  (1.651 m), weight 143 lb 9.6 oz (65.137 kg), SpO2 100.00%. Body mass index is 23.9 kg/(m^2).  ECOG PERFORMANCE STATUS: 1 - Symptomatic but completely ambulatory  General appearance: Alert, cooperative, well nourished, thin frame, no apparent distress Head: Normocephalic, without obvious abnormality, atraumatic Eyes: Conjunctivae/corneas clear, PERRLA, EOMI Nose: Nares, septum and mucosa are normal, no  drainage or sinus tenderness Neck: No adenopathy, supple, symmetrical, trachea midline, no tenderness Resp: Clear to auscultation bilaterally, no wheezes/rales/rhonchi Cardio: Regular rate and rhythm, S1, S2 normal, no murmur, click, rub or gallop, no edema, right chest Port-A-Cath without signs of infection Breasts:  Deferred GI: Soft, not distended, non-tender, hypoactive bowel sounds, no organomegaly Skin: No rashes/lesions, skin warm and dry, no erythematous areas, no cyanosis  M/S:  Atraumatic, normal strength in all extremities, normal range of motion, no clubbing  Lymph nodes: Cervical, supraclavicular, and axillary nodes normal Neurologic: Grossly normal, cranial nerves II through XII intact, alert and oriented x 3 Psych: Appropriate affect/anxious at times   LABORATORY DATA: Lab Results  Component Value Date   WBC 12.6* 06/15/2013   HGB 11.6 06/15/2013   HCT 35.2 06/15/2013   MCV 86.9 06/15/2013   PLT 307 06/15/2013      Chemistry      Component Value Date/Time   NA  141 06/02/2013 1225   NA 140 04/07/2013 1058   K 4.4 06/02/2013 1225   K 4.0 04/07/2013 1058   CL 106 04/07/2013 1058   CO2 25 06/02/2013 1225   CO2 24 04/07/2013 1058   BUN 13.4 06/02/2013 1225   BUN 10 04/07/2013 1058   CREATININE 0.7 06/02/2013 1225   CREATININE 0.66 04/07/2013 1058      Component Value Date/Time   CALCIUM 8.9 06/02/2013 1225   CALCIUM 9.2 04/07/2013 1058   ALKPHOS 86 06/02/2013 1225   AST 33 06/02/2013 1225   ALT 27 06/02/2013 1225   BILITOT <0.20 06/02/2013 1225       RADIOGRAPHIC STUDIES: No results found.      ASSESSMENT: Amanda Coleman is a 47 y.o. female with:  #1  For 3-4 months experienced a left nipple bloody discharge. She had a recent mammogram that did not show a mass. She had a physical examination which did show a mass measuring 3.4 cm at the 7 o'clock position. MRI on 03/24/2013 revealed the mass to be 5.0 cm. She went on to have a biopsy performed at the 7  o'clock position that showed invasive ductal carcinoma grade 1. Prognostic markers revealed the tumor to be estrogen receptor positive, progesterone receptor positive, HER-2/neu negative with a low Ki-67 11%.  #2  Patient is now status post mastectomy as of 04/13/2013. The final pathology did reveal invasive ductal carcinoma with ductal carcinoma in situ. Tumor is ER positive. We sent her tumor for Oncotype testing, but insufficient amount of tumor specimen meant testing could not be performed.  Patient's Dobransky was discussed again at the multidisciplinary breast conference. It was recommended by the team because there was significant amount of invasive disease that she should get chemotherapy.  Dr. Welton Flakes then discussed with the patient and her husband role of chemotherapy.  #3  Adjuvant chemotherapy consisting of every 3 week Taxotere/Cytoxan x 4 cycles with granulocyte support with Neulasta injection on day 2 of each cycle. Patient begun her adjuvant chemotherapy starting on 05/25/2013.     PLAN:  #1 proceed with cycle #2 of Taxotere Cytoxan.  #2 patient had a lot of questions which I have answered for her.  #3 she'll be seen back in one week's time for interim lab in followup  The length of time of the face-to-face encounter was 30    minutes. More than 50% of time was spent counseling and coordination of care.  Drue Second, MD Medical/Oncology [facility] [phone] (beeper) [phone] (Office)

## 2013-06-16 ENCOUNTER — Ambulatory Visit (HOSPITAL_BASED_OUTPATIENT_CLINIC_OR_DEPARTMENT_OTHER): Payer: BC Managed Care – PPO

## 2013-06-16 VITALS — BP 114/80 | HR 70 | Temp 98.2°F

## 2013-06-16 DIAGNOSIS — C50512 Malignant neoplasm of lower-outer quadrant of left female breast: Secondary | ICD-10-CM

## 2013-06-16 DIAGNOSIS — C50519 Malignant neoplasm of lower-outer quadrant of unspecified female breast: Secondary | ICD-10-CM

## 2013-06-16 DIAGNOSIS — Z5189 Encounter for other specified aftercare: Secondary | ICD-10-CM

## 2013-06-16 MED ORDER — PEGFILGRASTIM INJECTION 6 MG/0.6ML
6.0000 mg | Freq: Once | SUBCUTANEOUS | Status: AC
  Administered 2013-06-16: 6 mg via SUBCUTANEOUS
  Filled 2013-06-16: qty 0.6

## 2013-06-22 ENCOUNTER — Other Ambulatory Visit: Payer: BC Managed Care – PPO | Admitting: Lab

## 2013-06-22 ENCOUNTER — Other Ambulatory Visit: Payer: Self-pay

## 2013-06-22 ENCOUNTER — Ambulatory Visit: Payer: BC Managed Care – PPO | Admitting: Family

## 2013-06-23 ENCOUNTER — Other Ambulatory Visit (HOSPITAL_BASED_OUTPATIENT_CLINIC_OR_DEPARTMENT_OTHER): Payer: BC Managed Care – PPO | Admitting: Lab

## 2013-06-23 ENCOUNTER — Encounter: Payer: Self-pay | Admitting: Family

## 2013-06-23 ENCOUNTER — Ambulatory Visit (HOSPITAL_BASED_OUTPATIENT_CLINIC_OR_DEPARTMENT_OTHER): Payer: BC Managed Care – PPO | Admitting: Family

## 2013-06-23 VITALS — BP 117/77 | HR 78 | Temp 98.7°F | Resp 18 | Ht 65.0 in | Wt 147.0 lb

## 2013-06-23 DIAGNOSIS — C50512 Malignant neoplasm of lower-outer quadrant of left female breast: Secondary | ICD-10-CM

## 2013-06-23 DIAGNOSIS — Z170 Estrogen receptor positive status [ER+]: Secondary | ICD-10-CM

## 2013-06-23 DIAGNOSIS — C50319 Malignant neoplasm of lower-inner quadrant of unspecified female breast: Secondary | ICD-10-CM

## 2013-06-23 DIAGNOSIS — C50912 Malignant neoplasm of unspecified site of left female breast: Secondary | ICD-10-CM

## 2013-06-23 LAB — COMPREHENSIVE METABOLIC PANEL
ALT: 23 U/L (ref 0–35)
Albumin: 3.7 g/dL (ref 3.5–5.2)
BUN: 11 mg/dL (ref 6–23)
CO2: 28 mEq/L (ref 19–32)
Calcium: 8.9 mg/dL (ref 8.4–10.5)
Chloride: 102 mEq/L (ref 96–112)
Glucose, Bld: 95 mg/dL (ref 70–99)
Sodium: 137 mEq/L (ref 135–145)
Total Bilirubin: 0.1 mg/dL — ABNORMAL LOW (ref 0.3–1.2)
Total Protein: 6.3 g/dL (ref 6.0–8.3)

## 2013-06-23 LAB — CBC WITH DIFFERENTIAL/PLATELET
BASO%: 0.5 % (ref 0.0–2.0)
Basophils Absolute: 0.3 10*3/uL — ABNORMAL HIGH (ref 0.0–0.1)
MCHC: 31.2 g/dL — ABNORMAL LOW (ref 31.5–36.0)
MCV: 87.8 fL (ref 79.5–101.0)
MONO#: 0.3 10*3/uL (ref 0.1–0.9)
RBC: 3.9 10*6/uL (ref 3.70–5.45)
WBC: 51.6 10*3/uL (ref 3.9–10.3)
lymph#: 3.3 10*3/uL (ref 0.9–3.3)

## 2013-06-23 NOTE — Patient Instructions (Signed)
Please contact us at [phone] if you have any questions or concerns.  Please continue to do well and enjoy life!!!  Get plenty of rest, drink plenty of water, exercise daily (walking), eat a balanced diet.  Results for orders placed in visit on 06/23/13 (from the past 24 hour(s))  CBC WITH DIFFERENTIAL     Status: Abnormal   Collection Time    06/23/13  3:22 PM      Result Value Range   WBC 51.6 (*) 3.9 - 10.3 10e3/uL   NEUT# 47.4 (*) 1.5 - 6.5 10e3/uL   HGB 10.7 (*) 11.6 - 15.9 g/dL   HCT 81.1 (*) 91.4 - 78.2 %   Platelets 216  145 - 400 10e3/uL   MCV 87.8  79.5 - 101.0 fL   MCH 27.4  25.1 - 34.0 pg   MCHC 31.2 (*) 31.5 - 36.0 g/dL   RBC 9.56  2.13 - 0.86 10e6/uL   RDW 14.3  11.2 - 14.5 %   lymph# 3.3  0.9 - 3.3 10e3/uL   MONO# 0.3  0.1 - 0.9 10e3/uL   Eosinophils Absolute 0.2  0.0 - 0.5 10e3/uL   Basophils Absolute 0.3 (*) 0.0 - 0.1 10e3/uL   NEUT% 92.0 (*) 38.4 - 76.8 %   LYMPH% 6.5 (*) 14.0 - 49.7 %   MONO% 0.6  0.0 - 14.0 %   EOS% 0.4  0.0 - 7.0 %   BASO% 0.5  0.0 - 2.0 %   Narrative:    Items were attached to this order: CMETPerformed At:  [facility]               [address]               [place], [place] [address]

## 2013-06-23 NOTE — Progress Notes (Signed)
[facility]  Telephone:([phone] [phone]  OFFICE PROGRESS NOTE    ID: Amanda Coleman   DOB: 1966/01/28  MR#: [id number]  JYN#:[id number]   PCP: Freddy Finner, MD RAD ONC: Lonie Peak, M.D.  SU: Emelia Loron, M.D. SUAlan Ripper Sanger, D.O.   DIAGNOSIS: Amanda Coleman is a 47 y.o. 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 Patient was seen in the multidisciplinary clinic on 03/29/2013.   STAGE:  Breast cancer of lower-outer quadrant of left female breast  Primary site: Breast (Left)  Staging method: AJCC 7th Edition  Clinical: Stage IIA (T2, N0, cM0)  Summary: Stage IIA (T2, N0, cM0)   PRIOR THERAPY: #1  For 3-4 months experienced a left nipple bloody discharge. She had a recent mammogram that did not show a mass. She had a physical examination which did show a mass measuring 3.4 cm at the 7 o'clock position. MRI on 03/24/2013 revealed the mass to be 5.0 cm. She went on to have a biopsy performed at the 7 o'clock position that showed invasive ductal carcinoma grade 1. Prognostic markers revealed the tumor to be estrogen receptor positive, progesterone receptor positive, HER-2/neu negative with a low Ki-67 11%.  #2  Patient is now status post mastectomy as of 04/13/2013. The final pathology did reveal invasive ductal carcinoma with ductal carcinoma in situ. Tumor is ER positive. We sent her tumor for Oncotype testing, but insufficient amount of tumor specimen meant testing could not be performed.  Patient's L[place] was discussed again at the multidisciplinary breast conference. It was recommended by the team because there was significant amount of invasive disease that she should get chemotherapy.  Dr. Welton Flakes then discussed with the patient and her husband role of chemotherapy.  #3 Dr. Welton Flakes and the patient discussed different chemotherapy options and decided upon Taxotere Cytoxan to be given every 3 weeks for a total  of 4 cycles.   CURRENT THERAPY: Adjuvant chemotherapy consisting of every 3 week Taxotere/Cytoxan x 4 cycles with granulocyte support with Neulasta injection on day 2 of each cycle.    INTERVAL HISTORY: Amanda Coleman 47 y.o. female returns for followup visit after receiving her second cycle of adjuvant chemotherapy consisting of every three-week Taxotere/Cytoxan.  She is accompanied for today's office visit by her husband Amanda Coleman.  Her interval history is significant for experiencing the lower back aches after receiving Neulasta injection, feeling like her tongue was "coated" and continued alteration of taste.  She states her night sweats and insomnia have improved.   She has been alternating between Motrin and Tylenol with Claritin for her aches after Neulasta injection.  She states this combination has improved her achiness after Neulasta injection tremendously.  Her interval history is otherwise stable and unremarkable.   MEDICAL HISTORY: Past Medical History  Diagnosis Date  . Thyroid disease   . Breast cancer   . Hashimoto's disease   . Hypothyroidism   . PONV (postoperative nausea and vomiting)   . GERD (gastroesophageal reflux disease)     ALLERGIES:   Allergies  Allergen Reactions  . Penicillins Swelling  . Sulfa Antibiotics Rash    MEDICATIONS:  Current Outpatient Prescriptions  Medication Sig Dispense Refill  . acetaminophen (TYLENOL) 500 MG tablet Take 500 mg by mouth every 6 (six) hours as needed for pain.      Marland Kitchen ALPRAZolam (XANAX) 0.5 MG tablet Take 0.5 mg by mouth as needed for sleep.      Marland Kitchen  dexamethasone (DECADRON) 4 MG tablet Take 2 tablets (8mg  total) 2 times a day the day before Taxotere, then take 2 times a day starting the day after chemo for 3 days.  30 tablet  1  . diazepam (VALIUM) 2 MG tablet Take 2 mg by mouth daily as needed (for muscle spasms).       Marland Kitchen levothyroxine (SYNTHROID, LEVOTHROID) 112 MCG tablet Take 112 mcg by mouth daily before breakfast.      .  lidocaine-prilocaine (EMLA) cream Apply topically as needed. Apply 1-2 hours to port-a-cath site prior to infusion appointment.  30 g  3  . LORazepam (ATIVAN) 0.5 MG tablet Take 0.5 mg by mouth every 6 (six) hours as needed for anxiety (For nausea or vomiting).       . ondansetron (ZOFRAN) 8 MG tablet Take 2 times a day starting the day after chemo for 3 days, then take 2 times a day as needed for nausea and vomiting  30 tablet  1  . prochlorperazine (COMPAZINE) 10 MG tablet Take 1 tablet (10 mg total) by mouth every 6 (six) hours as needed.  30 tablet  1  . zolpidem (AMBIEN) 10 MG tablet Take 10 mg by mouth at bedtime as needed for sleep.       No current facility-administered medications for this visit.    SURGICAL HISTORY:  Past Surgical History  Procedure Laterality Date  . Abdominal hysterectomy    . Gum graft    . Simple mastectomy with axillary sentinel node biopsy Left 04/13/2013    Dr Dwain Sarna  . Mastectomy w/ sentinel node biopsy Left 04/13/2013    Procedure: LEFT SKIN SPARING MASTECTOMY WITH LEFT AXILLARY SENTINEL NODE BIOPSY;  Surgeon: Emelia Loron, MD;  Location: [facility] OR;  Service: General;  Laterality: Left;  . Breast reconstruction with placement of tissue expander and flex hd (acellular hydrated dermis) Left 04/13/2013    Procedure: LEFT BREAST RECONSTRUCTION WITH PLACEMENT OF TISSUE EXPANDER AND FLEX HD (ACELLULAR HYDRATED DERMIS);  Surgeon: Wayland Denis, DO;  Location: [facility] OR;  Service: Plastics;  Laterality: Left;  . Portacath placement N/A 05/18/2013    Procedure: INSERTION PORT-A-CATH;  Surgeon: Emelia Loron, MD;  Location: [facility] ORS;  Service: General;  Laterality: N/A;    REVIEW OF SYSTEMS:  Pertinent items are noted in HPI.   A 10 point review of systems was completed and is negative except as noted above.  Amanda Coleman denies any other symptomatology including fatigue, fever or chills, headache, vision changes, swollen glands, cough or shortness of breath, chest pain  or discomfort, vomiting,  constipation, change in urinary habits, any other arthralgias/myalgias, unusual bleeding/bruising or any other symptomatology.   PHYSICAL EXAMINATION: Blood pressure 117/77, pulse 78, temperature 98.7 F (37.1 C), temperature source Oral, resp. rate 18, height 5\' 5"  (1.651 m), weight 147 lb (66.679 kg). Body mass index is 24.46 kg/(m^2).  ECOG PERFORMANCE STATUS: 1 - Symptomatic but completely ambulatory  General appearance: Alert, cooperative, well nourished, thin frame, no apparent distress Head: Normocephalic, chemotherapy induced alopecia, atraumatic, the patient is wearing a hat Eyes: Conjunctivae/corneas clear, PERRLA, EOMI Nose: Nares, septum and mucosa are normal, no drainage or sinus tenderness Neck: No adenopathy, supple, symmetrical, trachea midline, no tenderness Resp: Clear to auscultation bilaterally, no wheezes/rales/rhonchi Cardio: Regular rate and rhythm, S1, S2 normal, no murmur, click, rub or gallop, no edema, right chest Port-A-Cath without signs of infection Breasts:  Deferred GI: Soft, not distended, non-tender, hypoactive bowel sounds, no organomegaly Skin: No rashes/lesions, skin  warm and dry, no erythematous areas, no cyanosis  M/S:  Atraumatic, normal strength in all extremities, normal range of motion, no clubbing  Lymph nodes: Cervical, supraclavicular, and axillary nodes normal Neurologic: Grossly normal, cranial nerves II through XII intact, alert and oriented x 3 Psych: Appropriate affect   LABORATORY DATA: Lab Results  Component Value Date   WBC 51.6* 06/23/2013   HGB 10.7* 06/23/2013   HCT 34.2* 06/23/2013   MCV 87.8 06/23/2013   PLT 216 06/23/2013      Chemistry      Component Value Date/Time   NA 137 06/23/2013 1522   NA 141 06/15/2013 0951   K 4.2 06/23/2013 1522   K 3.7 06/15/2013 0951   CL 102 06/23/2013 1522   CO2 28 06/23/2013 1522   CO2 22 06/15/2013 0951   BUN 11 06/23/2013 1522   BUN 14.5 06/15/2013 0951    CREATININE 0.70 06/23/2013 1522   CREATININE 0.7 06/15/2013 0951      Component Value Date/Time   CALCIUM 8.9 06/23/2013 1522   CALCIUM 9.3 06/15/2013 0951   ALKPHOS 110 06/23/2013 1522   ALKPHOS 66 06/15/2013 0951   AST 34 06/23/2013 1522   AST 17 06/15/2013 0951   ALT 23 06/23/2013 1522   ALT 18 06/15/2013 0951   BILITOT 0.1* 06/23/2013 1522   BILITOT 0.46 06/15/2013 0951       RADIOGRAPHIC STUDIES: No results found.    ASSESSMENT: Amanda Coleman is a 47 y.o. female with:  #1  For 3-4 months experienced a left nipple bloody discharge. She had a recent mammogram that did not show a mass. She had a physical examination which did show a mass measuring 3.4 cm at the 7 o'clock position. MRI on 03/24/2013 revealed the mass to be 5.0 cm. She went on to have a biopsy performed at the 7 o'clock position that showed invasive ductal carcinoma grade 1. Prognostic markers revealed the tumor to be estrogen receptor positive, progesterone receptor positive, HER-2/neu negative with a low Ki-67 11%.  #2 Status post left breast mastectomy on 04/13/2013. The final pathology revealed estrogen receptor positive, invasive ductal carcinoma with The tumor was sent for Oncotype testing, but an insufficient amount of tumor specimen meant testing could not be performed.  Patient's Z[place] was discussed again at the multidisciplinary breast conference. It was recommended by the team because there was significant amount of invasive disease, Amanda Coleman should receive chemotherapy.  Dr. Welton Flakes discussed with the patient and her husband the role of chemotherapy.  #3  Adjuvant chemotherapy consisting of every 3 week Taxotere/Cytoxan x 4 cycles with granulocyte support with Neulasta injection on day 2 of each cycle began on 05/25/2013.  #4 Body aches after Neulasta injections and alteration of taste.   PLAN:  #1 Amanda Coleman received cycle #2 of adjuvant chemotherapy consisting of every three-week Taxotere/Cytoxan with Neulasta  injection on 06/15/2013.  Overall, she is tolerating chemotherapy fairly well.  #2 We plan to see her again on 07/06/2013 for an office visit/physical examination/laboratory evaluation prior to proceeding with adjuvant chemotherapy cycle #3 of 4 planned cycles.  Neulasta injection is scheduled for 07/07/2013.  All questions answered.  Amanda Coleman were encouraged to contact us in the interim with any questions, concerns, or problems.  Amanda Bras, NP-C 06/25/2013, 8:07 PM

## 2013-07-06 ENCOUNTER — Encounter: Payer: Self-pay | Admitting: Adult Health

## 2013-07-06 ENCOUNTER — Ambulatory Visit (HOSPITAL_BASED_OUTPATIENT_CLINIC_OR_DEPARTMENT_OTHER): Payer: BC Managed Care – PPO | Admitting: Adult Health

## 2013-07-06 ENCOUNTER — Ambulatory Visit (HOSPITAL_BASED_OUTPATIENT_CLINIC_OR_DEPARTMENT_OTHER): Payer: BC Managed Care – PPO

## 2013-07-06 ENCOUNTER — Other Ambulatory Visit (HOSPITAL_BASED_OUTPATIENT_CLINIC_OR_DEPARTMENT_OTHER): Payer: BC Managed Care – PPO | Admitting: Lab

## 2013-07-06 ENCOUNTER — Other Ambulatory Visit: Payer: BC Managed Care – PPO | Admitting: Lab

## 2013-07-06 VITALS — BP 124/82 | HR 86 | Temp 98.4°F | Resp 18 | Ht 65.0 in | Wt 145.0 lb

## 2013-07-06 DIAGNOSIS — R52 Pain, unspecified: Secondary | ICD-10-CM

## 2013-07-06 DIAGNOSIS — Z170 Estrogen receptor positive status [ER+]: Secondary | ICD-10-CM

## 2013-07-06 DIAGNOSIS — C50512 Malignant neoplasm of lower-outer quadrant of left female breast: Secondary | ICD-10-CM

## 2013-07-06 DIAGNOSIS — R439 Unspecified disturbances of smell and taste: Secondary | ICD-10-CM

## 2013-07-06 DIAGNOSIS — C50319 Malignant neoplasm of lower-inner quadrant of unspecified female breast: Secondary | ICD-10-CM

## 2013-07-06 DIAGNOSIS — Z5111 Encounter for antineoplastic chemotherapy: Secondary | ICD-10-CM

## 2013-07-06 DIAGNOSIS — C50912 Malignant neoplasm of unspecified site of left female breast: Secondary | ICD-10-CM

## 2013-07-06 LAB — CBC WITH DIFFERENTIAL/PLATELET
BASO%: 0.4 % (ref 0.0–2.0)
EOS%: 0 % (ref 0.0–7.0)
Eosinophils Absolute: 0 10*3/uL (ref 0.0–0.5)
LYMPH%: 8.5 % — ABNORMAL LOW (ref 14.0–49.7)
MCHC: 32.9 g/dL (ref 31.5–36.0)
MONO#: 0.8 10*3/uL (ref 0.1–0.9)
Platelets: 313 10*3/uL (ref 145–400)
RBC: 3.92 10*6/uL (ref 3.70–5.45)
RDW: 14.9 % — ABNORMAL HIGH (ref 11.2–14.5)
WBC: 10 10*3/uL (ref 3.9–10.3)

## 2013-07-06 LAB — COMPREHENSIVE METABOLIC PANEL (CC13)
ALT: 15 U/L (ref 0–55)
AST: 19 U/L (ref 5–34)
Albumin: 4.2 g/dL (ref 3.5–5.0)
CO2: 25 mEq/L (ref 22–29)
Calcium: 9.8 mg/dL (ref 8.4–10.4)
Creatinine: 0.7 mg/dL (ref 0.6–1.1)
Glucose: 80 mg/dl (ref 70–140)
Sodium: 142 mEq/L (ref 136–145)
Total Bilirubin: 0.35 mg/dL (ref 0.20–1.20)

## 2013-07-06 MED ORDER — DEXAMETHASONE SODIUM PHOSPHATE 20 MG/5ML IJ SOLN
INTRAMUSCULAR | Status: AC
  Filled 2013-07-06: qty 5

## 2013-07-06 MED ORDER — SODIUM CHLORIDE 0.9 % IV SOLN
Freq: Once | INTRAVENOUS | Status: AC
  Administered 2013-07-06: 13:00:00 via INTRAVENOUS

## 2013-07-06 MED ORDER — ONDANSETRON 16 MG/50ML IVPB (CHCC)
16.0000 mg | Freq: Once | INTRAVENOUS | Status: AC
  Administered 2013-07-06: 16 mg via INTRAVENOUS

## 2013-07-06 MED ORDER — SODIUM CHLORIDE 0.9 % IJ SOLN
10.0000 mL | INTRAMUSCULAR | Status: DC | PRN
  Administered 2013-07-06: 10 mL
  Filled 2013-07-06: qty 10

## 2013-07-06 MED ORDER — DOCETAXEL CHEMO INJECTION 160 MG/16ML
75.0000 mg/m2 | Freq: Once | INTRAVENOUS | Status: AC
  Administered 2013-07-06: 130 mg via INTRAVENOUS
  Filled 2013-07-06: qty 13

## 2013-07-06 MED ORDER — ONDANSETRON 16 MG/50ML IVPB (CHCC)
INTRAVENOUS | Status: AC
  Filled 2013-07-06: qty 16

## 2013-07-06 MED ORDER — DEXAMETHASONE SODIUM PHOSPHATE 20 MG/5ML IJ SOLN
20.0000 mg | Freq: Once | INTRAMUSCULAR | Status: AC
  Administered 2013-07-06: 20 mg via INTRAVENOUS

## 2013-07-06 MED ORDER — HEPARIN SOD (PORK) LOCK FLUSH 100 UNIT/ML IV SOLN
500.0000 [IU] | Freq: Once | INTRAVENOUS | Status: AC | PRN
  Administered 2013-07-06: 500 [IU]
  Filled 2013-07-06: qty 5

## 2013-07-06 MED ORDER — SODIUM CHLORIDE 0.9 % IV SOLN
600.0000 mg/m2 | Freq: Once | INTRAVENOUS | Status: AC
  Administered 2013-07-06: 1060 mg via INTRAVENOUS
  Filled 2013-07-06: qty 53

## 2013-07-06 NOTE — Patient Instructions (Signed)
[facility] Discharge Instructions for Patients Receiving Chemotherapy  Today you received the following chemotherapy agents Taxotere,Cytoxan To help prevent nausea and vomiting after your treatment, we encourage you to take your nausea medication as prescribed.  If you develop nausea and vomiting that is not controlled by your nausea medication, call the clinic.   BELOW ARE SYMPTOMS THAT SHOULD BE REPORTED IMMEDIATELY:  *FEVER GREATER THAN 100.5 F  *CHILLS WITH OR WITHOUT FEVER  NAUSEA AND VOMITING THAT IS NOT CONTROLLED WITH YOUR NAUSEA MEDICATION  *UNUSUAL SHORTNESS OF BREATH  *UNUSUAL BRUISING OR BLEEDING  TENDERNESS IN MOUTH AND THROAT WITH OR WITHOUT PRESENCE OF ULCERS  *URINARY PROBLEMS  *BOWEL PROBLEMS  UNUSUAL RASH Items with * indicate a potential emergency and should be followed up as soon as possible.  Feel free to call the clinic you have any questions or concerns. The clinic phone number is [phone].    

## 2013-07-06 NOTE — Progress Notes (Signed)
[facility]  Telephone:(336) [phone] Fax:(336) [phone]  OFFICE PROGRESS NOTE    ID: Tamella Tuccillo Radler   DOB: 12/03/1965  MR#: [id number]  JYN#:[id number]   PCP: Freddy Finner, MD RAD ONC: Lonie Peak, M.D.  SU: Emelia Loron, M.D. SUAlan Ripper Sanger, D.O.   DIAGNOSIS: Renatha Rosen Donatelli is a 47 y.o. 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 Patient was seen in the multidisciplinary clinic on 03/29/2013.   STAGE:  Breast cancer of lower-outer quadrant of left female breast  Primary site: Breast (Left)  Staging method: AJCC 7th Edition  Clinical: Stage IIA (T2, N0, cM0)  Summary: Stage IIA (T2, N0, cM0)   PRIOR THERAPY: #1  For 3-4 months experienced a left nipple bloody discharge. She had a recent mammogram that did not show a mass. She had a physical examination which did show a mass measuring 3.4 cm at the 7 o'clock position. MRI on 03/24/2013 revealed the mass to be 5.0 cm. She went on to have a biopsy performed at the 7 o'clock position that showed invasive ductal carcinoma grade 1. Prognostic markers revealed the tumor to be estrogen receptor positive, progesterone receptor positive, HER-2/neu negative with a low Ki-67 11%.  #2  Patient is now status post mastectomy as of 04/13/2013. The final pathology did reveal invasive ductal carcinoma with ductal carcinoma in situ. Tumor is ER positive. We sent her tumor for Oncotype testing, but insufficient amount of tumor specimen meant testing could not be performed.  Patient's Schlotzhauer was discussed again at the multidisciplinary breast conference. It was recommended by the team because there was significant amount of invasive disease that she should get chemotherapy.  Dr. Welton Flakes then discussed with the patient and her husband role of chemotherapy.  #3 Dr. Welton Flakes and the patient discussed different chemotherapy options and decided upon Taxotere Cytoxan to be given every 3 weeks for a total  of 4 cycles.   CURRENT THERAPY: Taxotere/Cytoxan cycle 3 day 1  INTERVAL HISTORY: Devyn Griffing Mayden 47 y.o. female returns for evaluation prior to her third cycle of Taxotere/Cytoxan.  She is doing well today.  She denies any fevers, chills, nausea, vomiting, constipation, diarrhea, numbness.  She does have cracking in her fingertips, and is putting aquaphor on nightly.  She does describe bumps on her scalp which are improving, and tearing that is improving since using natural tears eye drops.  Otherwise, a 10 point ROS is negative.    MEDICAL HISTORY: Past Medical History  Diagnosis Date  . Thyroid disease   . Breast cancer   . Hashimoto's disease   . Hypothyroidism   . PONV (postoperative nausea and vomiting)   . GERD (gastroesophageal reflux disease)     ALLERGIES:   Allergies  Allergen Reactions  . Penicillins Swelling  . Sulfa Antibiotics Rash    MEDICATIONS:  Current Outpatient Prescriptions  Medication Sig Dispense Refill  . acetaminophen (TYLENOL) 500 MG tablet Take 500 mg by mouth every 6 (six) hours as needed for pain.      Marland Kitchen ALPRAZolam (XANAX) 0.5 MG tablet Take 0.5 mg by mouth as needed for sleep.      Marland Kitchen dexamethasone (DECADRON) 4 MG tablet Take 2 tablets (8mg  total) 2 times a day the day before Taxotere, then take 2 times a day starting the day after chemo for 3 days.  30 tablet  1  . diazepam (VALIUM) 2 MG tablet Take 2 mg by mouth daily as needed (for  muscle spasms).       Marland Kitchen levothyroxine (SYNTHROID, LEVOTHROID) 112 MCG tablet Take 112 mcg by mouth daily before breakfast.      . lidocaine-prilocaine (EMLA) cream Apply topically as needed. Apply 1-2 hours to port-a-cath site prior to infusion appointment.  30 g  3  . LORazepam (ATIVAN) 0.5 MG tablet Take 0.5 mg by mouth every 6 (six) hours as needed for anxiety (For nausea or vomiting).       . ondansetron (ZOFRAN) 8 MG tablet Take 2 times a day starting the day after chemo for 3 days, then take 2 times a day as needed  for nausea and vomiting  30 tablet  1  . prochlorperazine (COMPAZINE) 10 MG tablet Take 1 tablet (10 mg total) by mouth every 6 (six) hours as needed.  30 tablet  1  . zolpidem (AMBIEN) 10 MG tablet Take 10 mg by mouth at bedtime as needed for sleep.       No current facility-administered medications for this visit.    SURGICAL HISTORY:  Past Surgical History  Procedure Laterality Date  . Abdominal hysterectomy    . Gum graft    . Simple mastectomy with axillary sentinel node biopsy Left 04/13/2013    Dr Dwain Sarna  . Mastectomy w/ sentinel node biopsy Left 04/13/2013    Procedure: LEFT SKIN SPARING MASTECTOMY WITH LEFT AXILLARY SENTINEL NODE BIOPSY;  Surgeon: Emelia Loron, MD;  Location: [facility] OR;  Service: General;  Laterality: Left;  . Breast reconstruction with placement of tissue expander and flex hd (acellular hydrated dermis) Left 04/13/2013    Procedure: LEFT BREAST RECONSTRUCTION WITH PLACEMENT OF TISSUE EXPANDER AND FLEX HD (ACELLULAR HYDRATED DERMIS);  Surgeon: Wayland Denis, DO;  Location: [facility] OR;  Service: Plastics;  Laterality: Left;  . Portacath placement N/A 05/18/2013    Procedure: INSERTION PORT-A-CATH;  Surgeon: Emelia Loron, MD;  Location: [facility] ORS;  Service: General;  Laterality: N/A;    REVIEW OF SYSTEMS:   A 10 point review of systems was completed and is negative except as noted above.   PHYSICAL EXAMINATION: Blood pressure 124/82, pulse 86, temperature 98.4 F (36.9 C), temperature source Oral, resp. rate 18, height 5\' 5"  (1.651 m), weight 145 lb (65.772 kg), SpO2 98.00%. Body mass index is 24.13 kg/(m^2). General: Patient is a well appearing female in no acute distress HEENT: PERRLA, sclerae anicteric no conjunctival pallor, MMM Neck: supple, no palpable adenopathy Lungs: clear to auscultation bilaterally, no wheezes, rhonchi, or rales Cardiovascular: regular rate rhythm, S1, S2, no murmurs, rubs or gallops Abdomen: Soft, non-tender, non-distended,  normoactive bowel sounds, no HSM Extremities: warm and well perfused, no clubbing, cyanosis, or edema Skin: No rashes or lesions Neuro: Non-focal ECOG PERFORMANCE STATUS: 1 - Symptomatic but completely ambulatory   LABORATORY DATA: Lab Results  Component Value Date   WBC 10.0 07/06/2013   HGB 11.1* 07/06/2013   HCT 33.8* 07/06/2013   MCV 86.4 07/06/2013   PLT 313 07/06/2013      Chemistry      Component Value Date/Time   NA 142 07/06/2013 1047   NA 137 06/23/2013 1522   K 3.8 07/06/2013 1047   K 4.2 06/23/2013 1522   CL 102 06/23/2013 1522   CO2 25 07/06/2013 1047   CO2 28 06/23/2013 1522   BUN 14.4 07/06/2013 1047   BUN 11 06/23/2013 1522   CREATININE 0.7 07/06/2013 1047   CREATININE 0.70 06/23/2013 1522      Component Value Date/Time   CALCIUM 9.8  07/06/2013 1047   CALCIUM 8.9 06/23/2013 1522   ALKPHOS 83 07/06/2013 1047   ALKPHOS 110 06/23/2013 1522   AST 19 07/06/2013 1047   AST 34 06/23/2013 1522   ALT 15 07/06/2013 1047   ALT 23 06/23/2013 1522   BILITOT 0.35 07/06/2013 1047   BILITOT 0.1* 06/23/2013 1522       RADIOGRAPHIC STUDIES: No results found.    ASSESSMENT: Zeriyah Wain Drees is a 47 y.o. female with:  #1  For 3-4 months experienced a left nipple bloody discharge. She had a recent mammogram that did not show a mass. She had a physical examination which did show a mass measuring 3.4 cm at the 7 o'clock position. MRI on 03/24/2013 revealed the mass to be 5.0 cm. She went on to have a biopsy performed at the 7 o'clock position that showed invasive ductal carcinoma grade 1. Prognostic markers revealed the tumor to be estrogen receptor positive, progesterone receptor positive, HER-2/neu negative with a low Ki-67 11%.  #2 Status post left breast mastectomy on 04/13/2013. The final pathology revealed estrogen receptor positive, invasive ductal carcinoma with The tumor was sent for Oncotype testing, but an insufficient amount of tumor specimen meant testing could not be  performed.  Patient's [place] was discussed again at the multidisciplinary breast conference. It was recommended by the team because there was significant amount of invasive disease, Mrs. Moser should receive chemotherapy.  Dr. Welton Flakes discussed with the patient and her husband the role of chemotherapy.  #3  Adjuvant chemotherapy consisting of every 3 week Taxotere/Cytoxan x 4 cycles with granulocyte support with Neulasta injection on day 2 of each cycle began on 05/25/2013.  #4 Body aches after Neulasta injections and alteration of taste.   PLAN:  #1 Patient is doing well today.  Her labs are stable, I reviewed them with her in detail.  She will proceed with treatment today.    #2 She will return tomorrow for Neulasta, and next week for labs and evaluation.    All questions answered.  Mr. and Mrs. Deschene were encouraged to contact us in the interim with any questions, concerns, or problems.  I spent 25 minutes counseling the patient face to face.  The total time spent in the appointment was 30 minutes.  Illa Level, NP Medical Oncology [facility] [phone] 07/06/2013, 11:49 AM

## 2013-07-06 NOTE — Patient Instructions (Signed)
Folliculitis  Folliculitis is redness, soreness, and swelling (inflammation) of the hair follicles. This condition can occur anywhere on the body. People with weakened immune systems, diabetes, or obesity have a greater risk of getting folliculitis. CAUSES  Bacterial infection. This is the most common cause.  Fungal infection.  Viral infection.  Contact with certain chemicals, especially oils and tars. Long-term folliculitis can result from bacteria that live in the nostrils. The bacteria may trigger multiple outbreaks of folliculitis over time. SYMPTOMS Folliculitis most commonly occurs on the scalp, thighs, legs, back, buttocks, and areas where hair is shaved frequently. An early sign of folliculitis is a small, white or yellow, pus-filled, itchy lesion (pustule). These lesions appear on a red, inflamed follicle. They are usually less than 0.2 inches (5 mm) wide. When there is an infection of the follicle that goes deeper, it becomes a boil or furuncle. A group of closely packed boils creates a larger lesion (carbuncle). Carbuncles tend to occur in hairy, sweaty areas of the body. DIAGNOSIS  Your caregiver can usually tell what is wrong by doing a physical exam. A sample may be taken from one of the lesions and tested in a lab. This can help determine what is causing your folliculitis. TREATMENT  Treatment may include:  Applying warm compresses to the affected areas.  Taking antibiotic medicines orally or applying them to the skin.  Draining the lesions if they contain a large amount of pus or fluid.  Laser hair removal for cases of long-lasting folliculitis. This helps to prevent regrowth of the hair. HOME CARE INSTRUCTIONS  Apply warm compresses to the affected areas as directed by your caregiver.  If antibiotics are prescribed, take them as directed. Finish them even if you start to feel better.  You may take over-the-counter medicines to relieve itching.  Do not shave  irritated skin.  Follow up with your caregiver as directed. SEEK IMMEDIATE MEDICAL CARE IF:   You have increasing redness, swelling, or pain in the affected area.  You have a fever. MAKE SURE YOU:  Understand these instructions.  Will watch your condition.  Will get help right away if you are not doing well or get worse. Document Released: 10/12/2001 Document Revised: 02/02/2012 Document Reviewed: 11/03/2011 [employer] Patient Information 2014 [employer].  

## 2013-07-07 ENCOUNTER — Telehealth: Payer: Self-pay | Admitting: *Deleted

## 2013-07-07 ENCOUNTER — Ambulatory Visit (HOSPITAL_BASED_OUTPATIENT_CLINIC_OR_DEPARTMENT_OTHER): Payer: BC Managed Care – PPO

## 2013-07-07 ENCOUNTER — Telehealth: Payer: Self-pay | Admitting: Oncology

## 2013-07-07 VITALS — BP 142/74 | HR 77 | Temp 97.3°F

## 2013-07-07 DIAGNOSIS — C50519 Malignant neoplasm of lower-outer quadrant of unspecified female breast: Secondary | ICD-10-CM

## 2013-07-07 DIAGNOSIS — C50512 Malignant neoplasm of lower-outer quadrant of left female breast: Secondary | ICD-10-CM

## 2013-07-07 DIAGNOSIS — Z5189 Encounter for other specified aftercare: Secondary | ICD-10-CM

## 2013-07-07 MED ORDER — PEGFILGRASTIM INJECTION 6 MG/0.6ML
6.0000 mg | Freq: Once | SUBCUTANEOUS | Status: AC
  Administered 2013-07-07: 6 mg via SUBCUTANEOUS
  Filled 2013-07-07: qty 0.6

## 2013-07-07 NOTE — Telephone Encounter (Signed)
Lm gve tx time for 12/11@ 11:45am...td

## 2013-07-07 NOTE — Telephone Encounter (Signed)
per 11/20 pof add tx 12/11. tx already added after f/u 12/11. no other orders.

## 2013-07-07 NOTE — Telephone Encounter (Signed)
Per staff message and POF I have scheduled appts.  JMW  

## 2013-07-07 NOTE — Telephone Encounter (Signed)
Pt is aware that tx will be added on 12/11. i sent MW an email to add the tx...td

## 2013-07-07 NOTE — Patient Instructions (Signed)

## 2013-07-12 ENCOUNTER — Other Ambulatory Visit (HOSPITAL_BASED_OUTPATIENT_CLINIC_OR_DEPARTMENT_OTHER): Payer: BC Managed Care – PPO | Admitting: Lab

## 2013-07-12 ENCOUNTER — Encounter: Payer: Self-pay | Admitting: Family

## 2013-07-12 ENCOUNTER — Ambulatory Visit (HOSPITAL_BASED_OUTPATIENT_CLINIC_OR_DEPARTMENT_OTHER): Payer: BC Managed Care – PPO | Admitting: Family

## 2013-07-12 VITALS — BP 128/78 | HR 92 | Temp 97.6°F | Resp 19 | Ht 65.0 in | Wt 154.9 lb

## 2013-07-12 DIAGNOSIS — E876 Hypokalemia: Secondary | ICD-10-CM

## 2013-07-12 DIAGNOSIS — C50912 Malignant neoplasm of unspecified site of left female breast: Secondary | ICD-10-CM

## 2013-07-12 DIAGNOSIS — C50319 Malignant neoplasm of lower-inner quadrant of unspecified female breast: Secondary | ICD-10-CM

## 2013-07-12 DIAGNOSIS — C50512 Malignant neoplasm of lower-outer quadrant of left female breast: Secondary | ICD-10-CM

## 2013-07-12 DIAGNOSIS — Z170 Estrogen receptor positive status [ER+]: Secondary | ICD-10-CM

## 2013-07-12 LAB — CBC WITH DIFFERENTIAL/PLATELET
Basophils Absolute: 0 10*3/uL (ref 0.0–0.1)
EOS%: 3.8 % (ref 0.0–7.0)
Eosinophils Absolute: 0.3 10*3/uL (ref 0.0–0.5)
HCT: 33.2 % — ABNORMAL LOW (ref 34.8–46.6)
LYMPH%: 17.2 % (ref 14.0–49.7)
MCH: 28.1 pg (ref 25.1–34.0)
MONO#: 0.5 10*3/uL (ref 0.1–0.9)
MONO%: 6.2 % (ref 0.0–14.0)
NEUT#: 6 10*3/uL (ref 1.5–6.5)
NEUT%: 72.2 % (ref 38.4–76.8)
Platelets: 166 10*3/uL (ref 145–400)
RDW: 15.2 % — ABNORMAL HIGH (ref 11.2–14.5)
WBC: 8.3 10*3/uL (ref 3.9–10.3)

## 2013-07-12 LAB — COMPREHENSIVE METABOLIC PANEL (CC13)
Albumin: 3.5 g/dL (ref 3.5–5.0)
Alkaline Phosphatase: 98 U/L (ref 40–150)
Anion Gap: 9 mEq/L (ref 3–11)
CO2: 28 mEq/L (ref 22–29)
Calcium: 8.5 mg/dL (ref 8.4–10.4)
Creatinine: 0.7 mg/dL (ref 0.6–1.1)
Glucose: 87 mg/dl (ref 70–140)
Total Bilirubin: 0.37 mg/dL (ref 0.20–1.20)

## 2013-07-12 NOTE — Progress Notes (Signed)
[facility]  [phone] [phone]  OFFICE PROGRESS NOTE    ID: Amanda Coleman   DOB: 08-Jun-1966  MR#: [id number]  ZHY#:[id number]   PCP: Amanda Finner, MD RAD ONC: Amanda Coleman, M.D.  SU: Amanda Coleman, M.D. SUAlan Ripper Coleman, D.O.   DIAGNOSIS: Amanda Coleman is a 47 y.o. 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 Patient was seen in the multidisciplinary clinic on 03/29/2013.   STAGE:  Breast cancer of lower-outer quadrant of left female breast  Primary site: Breast (Left)  Staging method: AJCC 7th Edition  Clinical: Stage IIA (T2, N0, cM0)  Summary: Stage IIA (T2, N0, cM0)   PRIOR THERAPY: #1  For 3-4 months experienced a left nipple bloody discharge. She had a recent mammogram that did not show a mass. She had a physical examination which did show a mass measuring 3.4 cm at the 7 o'clock position. MRI on 03/24/2013 revealed the mass to be 5.0 cm. She went on to have a biopsy performed at the 7 o'clock position that showed invasive ductal carcinoma grade 1. Prognostic markers revealed the tumor to be estrogen receptor positive, progesterone receptor positive, HER-2/neu negative with a low Ki-67 11%.  #2  Patient is now status post mastectomy as of 04/13/2013. The final pathology did reveal invasive ductal carcinoma with ductal carcinoma in situ. Tumor is ER positive. We sent her tumor for Oncotype testing, but insufficient amount of tumor specimen meant testing could not be performed.  Patient's Link was discussed again at the multidisciplinary breast conference. It was recommended by the team because there was significant amount of invasive disease that she should get chemotherapy.  Dr. Welton Coleman then discussed with the patient and her husband role of chemotherapy.  #3 Dr. Welton Coleman and the patient discussed different chemotherapy options and decided upon Taxotere Cytoxan to be given every 3 weeks for a total  of 4 cycles.   CURRENT THERAPY: Adjuvant chemotherapy consisting of every 3 week Taxotere/Cytoxan x 4 cycles with granulocyte support with Neulasta injection on day 2 of each cycle.   INTERVAL HISTORY: Amanda Coleman is a 47 y.o. female who returns for followup visit after receiving her third cycle of adjuvant chemotherapy consisting of every three-week Taxotere/Cytoxan on 07/06/2013.  She is accompanied for today's office visit by her husband Amanda Coleman.  Her interval history is significant for experiencing body aches after last chemotherapy cycle.  Her interval history is otherwise stable and unremarkable.   MEDICAL HISTORY: Past Medical History  Diagnosis Date  . Thyroid disease   . Breast cancer   . Hashimoto's disease   . Hypothyroidism   . PONV (postoperative nausea and vomiting)   . GERD (gastroesophageal reflux disease)     ALLERGIES:   Allergies  Allergen Reactions  . Penicillins Swelling  . Sulfa Antibiotics Rash    MEDICATIONS:  Current Outpatient Prescriptions  Medication Sig Dispense Refill  . acetaminophen (TYLENOL) 500 MG tablet Take 500 mg by mouth every 6 (six) hours as needed for pain.      Marland Kitchen ALPRAZolam (XANAX) 0.5 MG tablet Take 0.5 mg by mouth as needed for sleep.      Marland Kitchen dexamethasone (DECADRON) 4 MG tablet Take 2 tablets (8mg  total) 2 times a day the day before Taxotere, then take 2 times a day starting the day after chemo for 3 days.  30 tablet  1  . diazepam (VALIUM) 2 MG tablet Take 2 mg by mouth  daily as needed (for muscle spasms).       Marland Kitchen levothyroxine (SYNTHROID, LEVOTHROID) 112 MCG tablet Take 112 mcg by mouth daily before breakfast.      . lidocaine-prilocaine (EMLA) cream Apply topically as needed. Apply 1-2 hours to port-a-cath site prior to infusion appointment.  30 g  3  . LORazepam (ATIVAN) 0.5 MG tablet Take 0.5 mg by mouth every 6 (six) hours as needed for anxiety (For nausea or vomiting).       . ondansetron (ZOFRAN) 8 MG tablet Take 2 times a day  starting the day after chemo for 3 days, then take 2 times a day as needed for nausea and vomiting  30 tablet  1  . prochlorperazine (COMPAZINE) 10 MG tablet Take 1 tablet (10 mg total) by mouth every 6 (six) hours as needed.  30 tablet  1  . zolpidem (AMBIEN) 10 MG tablet Take 10 mg by mouth at bedtime as needed for sleep.       No current facility-administered medications for this visit.    SURGICAL HISTORY:  Past Surgical History  Procedure Laterality Date  . Abdominal hysterectomy    . Gum graft    . Simple mastectomy with axillary sentinel node biopsy Left 04/13/2013    Dr Amanda Coleman  . Mastectomy w/ sentinel node biopsy Left 04/13/2013    Procedure: LEFT SKIN SPARING MASTECTOMY WITH LEFT AXILLARY SENTINEL NODE BIOPSY;  Surgeon: Amanda Loron, MD;  Location: [facility] OR;  Service: General;  Laterality: Left;  . Breast reconstruction with placement of tissue expander and flex hd (acellular hydrated dermis) Left 04/13/2013    Procedure: LEFT BREAST RECONSTRUCTION WITH PLACEMENT OF TISSUE EXPANDER AND FLEX HD (ACELLULAR HYDRATED DERMIS);  Surgeon: Amanda Denis, DO;  Location: [facility] OR;  Service: Plastics;  Laterality: Left;  . Portacath placement N/A 05/18/2013    Procedure: INSERTION PORT-A-CATH;  Surgeon: Amanda Loron, MD;  Location: [facility] ORS;  Service: General;  Laterality: N/A;    REVIEW OF SYSTEMS:  Pertinent items are noted in HPI.   A 10 point review of systems was completed and is negative except as noted above.  Amanda Coleman denies any other symptomatology including fatigue, fever or chills, headache, vision changes, swollen glands, cough or shortness of breath, chest pain or discomfort, vomiting,  constipation, change in urinary habits, any other arthralgias/myalgias, unusual bleeding/bruising or any other symptomatology.    PHYSICAL EXAMINATION: Blood pressure 128/78, pulse 92, temperature 97.6 F (36.4 C), temperature source Oral, resp. rate 19, height 5\' 5"  (1.651 m), weight 154 lb  14.4 oz (70.262 kg). Body mass index is 25.78 kg/(m^2).  ECOG PERFORMANCE STATUS: 1 - Symptomatic but completely ambulatory  General appearance: Alert, cooperative, well nourished, thin frame, no apparent distress Head: Normocephalic, chemotherapy induced alopecia, atraumatic, the patient is wearing a hat Eyes: Conjunctivae/corneas clear, PERRLA, EOMI Nose: Nares, septum and mucosa are normal, no drainage or sinus tenderness Neck: No adenopathy, supple, symmetrical, trachea midline, no tenderness Resp: Clear to auscultation bilaterally, no wheezes/rales/rhonchi Cardio: Regular rate and rhythm, S1, S2 normal, no murmur, click, rub or gallop, no edema, right chest Port-A-Cath without signs of infection  Breasts:  Deferred GI: Soft, not distended, non-tender, hypoactive bowel sounds, no organomegaly Skin: No rashes/lesions, skin warm and dry, no erythematous areas, no cyanosis  M/S:  Atraumatic, normal strength in all extremities, normal range of motion, no clubbing  Lymph nodes: Cervical, supraclavicular, and axillary nodes normal Neurologic: Grossly normal, cranial nerves II through XII intact, alert and oriented x  3 Psych: Appropriate affect   LABORATORY DATA: Lab Results  Component Value Date   WBC 8.3 07/12/2013   HGB 10.7* 07/12/2013   HCT 33.2* 07/12/2013   MCV 87.4 07/12/2013   PLT 166 07/12/2013      Chemistry      Component Value Date/Time   NA 142 07/12/2013 0956   NA 137 06/23/2013 1522   K 3.4* 07/12/2013 0956   K 4.2 06/23/2013 1522   CL 102 06/23/2013 1522   CO2 28 07/12/2013 0956   CO2 28 06/23/2013 1522   BUN 8.8 07/12/2013 0956   BUN 11 06/23/2013 1522   CREATININE 0.7 07/12/2013 0956   CREATININE 0.70 06/23/2013 1522      Component Value Date/Time   CALCIUM 8.5 07/12/2013 0956   CALCIUM 8.9 06/23/2013 1522   ALKPHOS 98 07/12/2013 0956   ALKPHOS 110 06/23/2013 1522   AST 21 07/12/2013 0956   AST 34 06/23/2013 1522   ALT 24 07/12/2013 0956   ALT 23 06/23/2013  1522   BILITOT 0.37 07/12/2013 0956   BILITOT 0.1* 06/23/2013 1522       RADIOGRAPHIC STUDIES: No results found.    ASSESSMENT: Amanda Coleman is a 47 y.o. female with:  #1  For 3-4 months experienced a left nipple bloody discharge. She had a recent mammogram that did not show a mass. She had a physical examination which did show a mass measuring 3.4 cm at the 7 o'clock position. MRI on 03/24/2013 revealed the mass to be 5.0 cm. She went on to have a biopsy performed at the 7 o'clock position that showed invasive ductal carcinoma grade 1. Prognostic markers revealed the tumor to be estrogen receptor positive, progesterone receptor positive, HER-2/neu negative with a low Ki-67 11%.  #2 Status post left breast mastectomy on 04/13/2013. The final pathology revealed estrogen receptor positive, invasive ductal carcinoma with The tumor was sent for Oncotype testing, but an insufficient amount of tumor specimen meant testing could not be performed.  Patient's [place] was discussed again at the multidisciplinary breast conference. It was recommended by the team because there was significant amount of invasive disease, Mrs. Pertuit should receive chemotherapy.  Dr. Welton Coleman discussed with the patient and her husband the role of chemotherapy.  #3  Adjuvant chemotherapy consisting of every 3 week Taxotere/Cytoxan x 4 cycles with granulocyte support with Neulasta injection on day 2 of each cycle began on 05/25/2013.  #4 Body aches.  #5 hypokalemia   PLAN:  #1 Mrs. Kohn received cycle #3 of adjuvant chemotherapy consisting of every three-week Taxotere/Cytoxan with Neulasta injection on 07/06/2013.  Overall, she is tolerating chemotherapy fairly well.  #2 AmandaSelvage has mild hypokalemia and a list of foods rich in potassium was provided to her today.  Her potassium is 3.4 today and can be easily corrected with nutritional intake of potassium versus oral potassium supplementation.  #3 We plan to see her again on  07/27/2013 for an office visit/physical examination/laboratory evaluation prior to proceeding with adjuvant chemotherapy cycle #4 of 4 planned cycles.  Neulasta injection is scheduled for 07/28/2013.  #4 She is scheduled for genetic counseling/testing on 08/14/2013.  All questions answered.  Mr. and Mrs. Fischbach were encouraged to contact us in the interim with any questions, concerns, or problems.  Larina Bras, NP-C 07/12/2013   2:36 PM

## 2013-07-12 NOTE — Patient Instructions (Addendum)
Please contact us at [phone] if you have any questions or concerns.  Please continue to do well and enjoy life!!!  Get plenty of rest, drink plenty of water, exercise daily (walking), eat a balanced diet.  Breast Navigator [facility].  Results for orders placed in visit on 07/12/13 (from the past 24 hour(s))  CBC WITH DIFFERENTIAL     Status: Abnormal   Collection Time    07/12/13  9:56 AM      Result Value Range   WBC 8.3  3.9 - 10.3 10e3/uL   NEUT# 6.0  1.5 - 6.5 10e3/uL   HGB 10.7 (*) 11.6 - 15.9 g/dL   HCT 30.8 (*) 65.7 - 84.6 %   Platelets 166  145 - 400 10e3/uL   MCV 87.4  79.5 - 101.0 fL   MCH 28.1  25.1 - 34.0 pg   MCHC 32.1  31.5 - 36.0 g/dL   RBC 9.62  9.52 - 8.41 10e6/uL   RDW 15.2 (*) 11.2 - 14.5 %   lymph# 1.4  0.9 - 3.3 10e3/uL   MONO# 0.5  0.1 - 0.9 10e3/uL   Eosinophils Absolute 0.3  0.0 - 0.5 10e3/uL   Basophils Absolute 0.0  0.0 - 0.1 10e3/uL   NEUT% 72.2  38.4 - 76.8 %   LYMPH% 17.2  14.0 - 49.7 %   MONO% 6.2  0.0 - 14.0 %   EOS% 3.8  0.0 - 7.0 %   BASO% 0.6  0.0 - 2.0 %   Narrative:    Performed At:  [facility]               [address]               [place], [place] [address]  COMPREHENSIVE METABOLIC PANEL (CC13)     Status: Abnormal   Collection Time    07/12/13  9:56 AM      Result Value Range   Sodium 142  136 - 145 mEq/L   Potassium 3.4 (*) 3.5 - 5.1 mEq/L   Chloride 105  98 - 109 mEq/L   CO2 28  22 - 29 mEq/L   Glucose 87  70 - 140 mg/dl   BUN 8.8  7.0 - 10.2 mg/dL   Creatinine 0.7  0.6 - 1.1 mg/dL   Total Bilirubin 7.25  0.20 - 1.20 mg/dL   Alkaline Phosphatase 98  40 - 150 U/L   AST 21  5 - 34 U/L   ALT 24  0 - 55 U/L   Total Protein 6.0 (*) 6.4 - 8.3 g/dL   Albumin 3.5  3.5 - 5.0 g/dL   Calcium 8.5  8.4 - 36.6 mg/dL   Anion Gap 9  3 - 11 mEq/L   Narrative:    Performed At:  [facility]               [address]               [place], [place] [address]    Potassium Rich Foods List - Foods High in  Potassium      List of Foods High in Potassium: Foods with Potassium  Serving Size Potassium (mg)  Apricots, dried 10 halves 407  Avocados, raw 1 ounce 180  Bananas, raw 1 cup 594  Beets, cooked 1 cup 519  Brussel sprouts, cooked 1 cup 504  Cantaloupe 1 cup 494  Dates, dry 5 dates 51  Figs,  dry 2 figs 271  Kiwi fruit, raw 1 medium 252  Lima beans 1 cup 955  Melons, honeydew 1 cup 461  Milk, fat free or skim 1 cup 407  Nectarines 1 nectarine 288  Orange juice 1 cup 496  Oranges 1 orange 237  Pears (fresh) 1 pear 208  Peanuts dry roasted, unsalted 1 ounce 187  Potatoes, baked, 1 potato 1081  Prune juice 1 cup 707  Prunes, dried 1 cup 828  Raisins 1 cup 1089  Spinach, cooked 1 cup 839  Tomato products, canned sauce 1 cup 909  Winter squash 1 cup 896  Yogurt plain, skim milk 8 ounces 579  USDA Nutrient Database for Standard References, Release 15 for Potassium, K (mg)

## 2013-07-19 ENCOUNTER — Telehealth: Payer: Self-pay | Admitting: *Deleted

## 2013-07-19 NOTE — Telephone Encounter (Signed)
Pt called to inform she will be out of last dose of decadron on day 3 after chemo.  This RN called one dose in for pt at [facility] in [place].  Pt also request to see Dr. Welton Flakes for her last chemo visit.  Informed pt that I would let Dr. Welton Flakes know of her request.  Confirmed patient's genetics appt on 08/14/13.  Pt denies further needs at this time.  Encourage pt to call with questions or concerns.  Received verbal understanding.

## 2013-07-27 ENCOUNTER — Ambulatory Visit (HOSPITAL_BASED_OUTPATIENT_CLINIC_OR_DEPARTMENT_OTHER): Payer: BC Managed Care – PPO

## 2013-07-27 ENCOUNTER — Encounter: Payer: Self-pay | Admitting: Oncology

## 2013-07-27 ENCOUNTER — Ambulatory Visit (HOSPITAL_BASED_OUTPATIENT_CLINIC_OR_DEPARTMENT_OTHER): Payer: BC Managed Care – PPO | Admitting: Oncology

## 2013-07-27 ENCOUNTER — Other Ambulatory Visit: Payer: BC Managed Care – PPO | Admitting: Lab

## 2013-07-27 ENCOUNTER — Other Ambulatory Visit (HOSPITAL_BASED_OUTPATIENT_CLINIC_OR_DEPARTMENT_OTHER): Payer: BC Managed Care – PPO

## 2013-07-27 VITALS — BP 133/86 | HR 73 | Temp 99.0°F | Resp 18 | Ht 65.0 in | Wt 151.1 lb

## 2013-07-27 DIAGNOSIS — C50319 Malignant neoplasm of lower-inner quadrant of unspecified female breast: Secondary | ICD-10-CM

## 2013-07-27 DIAGNOSIS — E876 Hypokalemia: Secondary | ICD-10-CM

## 2013-07-27 DIAGNOSIS — C50512 Malignant neoplasm of lower-outer quadrant of left female breast: Secondary | ICD-10-CM

## 2013-07-27 DIAGNOSIS — Z5111 Encounter for antineoplastic chemotherapy: Secondary | ICD-10-CM

## 2013-07-27 DIAGNOSIS — C50912 Malignant neoplasm of unspecified site of left female breast: Secondary | ICD-10-CM

## 2013-07-27 DIAGNOSIS — Z170 Estrogen receptor positive status [ER+]: Secondary | ICD-10-CM

## 2013-07-27 LAB — COMPREHENSIVE METABOLIC PANEL (CC13)
ALT: 15 U/L (ref 0–55)
AST: 17 U/L (ref 5–34)
Alkaline Phosphatase: 91 U/L (ref 40–150)
BUN: 14.9 mg/dL (ref 7.0–26.0)
CO2: 25 mEq/L (ref 22–29)
Calcium: 9.6 mg/dL (ref 8.4–10.4)
Chloride: 108 mEq/L (ref 98–109)
Glucose: 81 mg/dl (ref 70–140)
Potassium: 4.4 mEq/L (ref 3.5–5.1)
Total Bilirubin: 0.3 mg/dL (ref 0.20–1.20)

## 2013-07-27 LAB — CBC WITH DIFFERENTIAL/PLATELET
BASO%: 0.4 % (ref 0.0–2.0)
Basophils Absolute: 0 10*3/uL (ref 0.0–0.1)
EOS%: 0 % (ref 0.0–7.0)
Eosinophils Absolute: 0 10*3/uL (ref 0.0–0.5)
LYMPH%: 10.7 % — ABNORMAL LOW (ref 14.0–49.7)
MCH: 29 pg (ref 25.1–34.0)
MCV: 86.8 fL (ref 79.5–101.0)
MONO#: 0.9 10*3/uL (ref 0.1–0.9)
MONO%: 7.6 % (ref 0.0–14.0)
NEUT#: 9.9 10*3/uL — ABNORMAL HIGH (ref 1.5–6.5)
Platelets: 328 10*3/uL (ref 145–400)
RBC: 3.64 10*6/uL — ABNORMAL LOW (ref 3.70–5.45)
RDW: 16.5 % — ABNORMAL HIGH (ref 11.2–14.5)
lymph#: 1.3 10*3/uL (ref 0.9–3.3)

## 2013-07-27 MED ORDER — DEXAMETHASONE SODIUM PHOSPHATE 20 MG/5ML IJ SOLN
INTRAMUSCULAR | Status: AC
  Filled 2013-07-27: qty 5

## 2013-07-27 MED ORDER — CYCLOPHOSPHAMIDE CHEMO INJECTION 1 GM
600.0000 mg/m2 | Freq: Once | INTRAMUSCULAR | Status: AC
  Administered 2013-07-27: 1060 mg via INTRAVENOUS
  Filled 2013-07-27: qty 53

## 2013-07-27 MED ORDER — SODIUM CHLORIDE 0.9 % IV SOLN
75.0000 mg/m2 | Freq: Once | INTRAVENOUS | Status: AC
  Administered 2013-07-27: 130 mg via INTRAVENOUS
  Filled 2013-07-27: qty 13

## 2013-07-27 MED ORDER — ONDANSETRON 16 MG/50ML IVPB (CHCC)
INTRAVENOUS | Status: AC
  Filled 2013-07-27: qty 16

## 2013-07-27 MED ORDER — SODIUM CHLORIDE 0.9 % IV SOLN
Freq: Once | INTRAVENOUS | Status: AC
  Administered 2013-07-27: 12:00:00 via INTRAVENOUS

## 2013-07-27 MED ORDER — DOCETAXEL CHEMO INJECTION 160 MG/16ML: 75.0000 mg/m2 | Freq: Once | INTRAVENOUS | Status: DC

## 2013-07-27 MED ORDER — HEPARIN SOD (PORK) LOCK FLUSH 100 UNIT/ML IV SOLN
500.0000 [IU] | Freq: Once | INTRAVENOUS | Status: AC | PRN
  Administered 2013-07-27: 500 [IU]
  Filled 2013-07-27: qty 5

## 2013-07-27 MED ORDER — ONDANSETRON 16 MG/50ML IVPB (CHCC)
16.0000 mg | Freq: Once | INTRAVENOUS | Status: AC
  Administered 2013-07-27: 16 mg via INTRAVENOUS

## 2013-07-27 MED ORDER — SODIUM CHLORIDE 0.9 % IJ SOLN
10.0000 mL | INTRAMUSCULAR | Status: DC | PRN
  Administered 2013-07-27: 10 mL
  Filled 2013-07-27: qty 10

## 2013-07-27 MED ORDER — DEXAMETHASONE SODIUM PHOSPHATE 20 MG/5ML IJ SOLN
20.0000 mg | Freq: Once | INTRAMUSCULAR | Status: AC
  Administered 2013-07-27: 20 mg via INTRAVENOUS

## 2013-07-27 NOTE — Patient Instructions (Signed)
[facility] Discharge Instructions for Patients Receiving Chemotherapy  Today you received the following chemotherapy agents taxotere, cytoxan.  To help prevent nausea and vomiting after your treatment, we encourage you to take your nausea medication zofran, compazine, ativan as ordered by Dr. Welton Flakes.   If you develop nausea and vomiting that is not controlled by your nausea medication, call the clinic.   BELOW ARE SYMPTOMS THAT SHOULD BE REPORTED IMMEDIATELY:  *FEVER GREATER THAN 100.5 F  *CHILLS WITH OR WITHOUT FEVER  NAUSEA AND VOMITING THAT IS NOT CONTROLLED WITH YOUR NAUSEA MEDICATION  *UNUSUAL SHORTNESS OF BREATH  *UNUSUAL BRUISING OR BLEEDING  TENDERNESS IN MOUTH AND THROAT WITH OR WITHOUT PRESENCE OF ULCERS  *URINARY PROBLEMS  *BOWEL PROBLEMS  UNUSUAL RASH Items with * indicate a potential emergency and should be followed up as soon as possible.  Feel free to call the clinic you have any questions or concerns. The clinic phone number is [phone].

## 2013-07-27 NOTE — Progress Notes (Signed)
Discharged at 1530 (+/-) while this nurse at lunch.  Ambulatory with spouse excited to ring bell for today's last treatment.

## 2013-07-27 NOTE — Progress Notes (Signed)
[facility]  Telephone:(336) [phone] Fax:(336) [phone]  OFFICE PROGRESS NOTE    ID: Amanda Coleman   DOB: [birth year]  MR#: [id number]  JYN#:[id number]   PCP: Freddy Finner, MD RAD ONC: Lonie Peak, M.D.  SU: Emelia Loron, M.D. SUAlan Ripper Sanger, D.O.   DIAGNOSIS: Amanda Coleman is a 47 y.o. 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 Patient was seen in the multidisciplinary clinic on 03/29/2013.   STAGE:  Breast cancer of lower-outer quadrant of left female breast  Primary site: Breast (Left)  Staging method: AJCC 7th Edition  Clinical: Stage IIA (T2, N0, cM0)  Summary: Stage IIA (T2, N0, cM0)   PRIOR THERAPY: #1  For 3-4 months experienced a left nipple bloody discharge. She had a recent mammogram that did not show a mass. She had a physical examination which did show a mass measuring 3.4 cm at the 7 o'clock position. MRI on 03/24/2013 revealed the mass to be 5.0 cm. She went on to have a biopsy performed at the 7 o'clock position that showed invasive ductal carcinoma grade 1. Prognostic markers revealed the tumor to be estrogen receptor positive, progesterone receptor positive, HER-2/neu negative with a low Ki-67 11%.  #2  Patient is now status post mastectomy as of 04/13/2013. The final pathology did reveal invasive ductal carcinoma with ductal carcinoma in situ. Tumor is ER positive. We sent her tumor for Oncotype testing, but insufficient amount of tumor specimen meant testing could not be performed.  Patient's [place] was discussed again at the multidisciplinary breast conference. It was recommended by the team because there was significant amount of invasive disease that she should get chemotherapy.  Dr. Welton Flakes then discussed with the patient and her husband role of chemotherapy.  #3 Dr. Welton Flakes and the patient discussed different chemotherapy options and decided upon Taxotere Cytoxan to be given every 3 weeks for a total  of 4 cycles. To be completed 07/27/2013   CURRENT THERAPY: Cycle 4 day 1 of Taxotere and Cytoxan 07/27/2013  INTERVAL HISTORY: Amanda Coleman is a 47 y.o. female who returns for followup visit clinically patient seems to be doing well. I do think she is tolerating the Taxotere very nicely as well as the Cytoxan. She does get aches pains she does have some peripheral neuropathy. She is taking her pain medications for this. She denies any nausea vomiting fevers chills or night sweats. She has no diarrhea or constipation. She is able to manage her bowels very nicely. Remainder of the 10 point review of systems is negative. e.   MEDICAL HISTORY: Past Medical History  Diagnosis Date  . Thyroid disease   . Breast cancer   . Hashimoto's disease   . Hypothyroidism   . PONV (postoperative nausea and vomiting)   . GERD (gastroesophageal reflux disease)     ALLERGIES:   Allergies  Allergen Reactions  . Penicillins Swelling  . Sulfa Antibiotics Rash    MEDICATIONS:  Current Outpatient Prescriptions  Medication Sig Dispense Refill  . acetaminophen (TYLENOL) 500 MG tablet Take 500 mg by mouth every 6 (six) hours as needed for pain.      Marland Kitchen ALPRAZolam (XANAX) 0.5 MG tablet Take 0.5 mg by mouth as needed for sleep.      Marland Kitchen dexamethasone (DECADRON) 4 MG tablet Take 2 tablets (8mg  total) 2 times a day the day before Taxotere, then take 2 times a day starting the day after chemo for 3 days.  30 tablet  1  . diazepam (VALIUM) 2 MG tablet Take 2 mg by mouth daily as needed (for muscle spasms).       Marland Kitchen levothyroxine (SYNTHROID, LEVOTHROID) 112 MCG tablet Take 112 mcg by mouth daily before breakfast.      . lidocaine-prilocaine (EMLA) cream Apply topically as needed. Apply 1-2 hours to port-a-cath site prior to infusion appointment.  30 g  3  . LORazepam (ATIVAN) 0.5 MG tablet Take 0.5 mg by mouth every 6 (six) hours as needed for anxiety (For nausea or vomiting).       . ondansetron (ZOFRAN) 8 MG tablet  Take 2 times a day starting the day after chemo for 3 days, then take 2 times a day as needed for nausea and vomiting  30 tablet  1  . prochlorperazine (COMPAZINE) 10 MG tablet Take 1 tablet (10 mg total) by mouth every 6 (six) hours as needed.  30 tablet  1  . zolpidem (AMBIEN) 10 MG tablet Take 10 mg by mouth at bedtime as needed for sleep.       No current facility-administered medications for this visit.    SURGICAL HISTORY:  Past Surgical History  Procedure Laterality Date  . Abdominal hysterectomy    . Gum graft    . Simple mastectomy with axillary sentinel node biopsy Left 04/13/2013    Dr Dwain Sarna  . Mastectomy w/ sentinel node biopsy Left 04/13/2013    Procedure: LEFT SKIN SPARING MASTECTOMY WITH LEFT AXILLARY SENTINEL NODE BIOPSY;  Surgeon: Emelia Loron, MD;  Location: [facility] OR;  Service: General;  Laterality: Left;  . Breast reconstruction with placement of tissue expander and flex hd (acellular hydrated dermis) Left 04/13/2013    Procedure: LEFT BREAST RECONSTRUCTION WITH PLACEMENT OF TISSUE EXPANDER AND FLEX HD (ACELLULAR HYDRATED DERMIS);  Surgeon: Wayland Denis, DO;  Location: [facility] OR;  Service: Plastics;  Laterality: Left;  . Portacath placement N/A 05/18/2013    Procedure: INSERTION PORT-A-CATH;  Surgeon: Emelia Loron, MD;  Location: [facility] ORS;  Service: General;  Laterality: N/A;    REVIEW OF SYSTEMS:  Pertinent items are noted in HPI.   A 10 point review of systems was completed and is negative except as noted above.  Mrs. Mealey denies any other symptomatology including fatigue, fever or chills, headache, vision changes, swollen glands, cough or shortness of breath, chest pain or discomfort, vomiting,  constipation, change in urinary habits, any other arthralgias/myalgias, unusual bleeding/bruising or any other symptomatology.    PHYSICAL EXAMINATION: Blood pressure 133/86, pulse 73, temperature 99 F (37.2 C), temperature source Oral, resp. rate 18, height 5\' 5"  (1.651  m), weight 151 lb 1.6 oz (68.539 kg). Body mass index is 25.14 kg/(m^2).  ECOG PERFORMANCE STATUS: 1 - Symptomatic but completely ambulatory  General appearance: Alert, cooperative, well nourished, thin frame, no apparent distress Head: Normocephalic, chemotherapy induced alopecia, atraumatic, the patient is wearing a hat Eyes: Conjunctivae/corneas clear, PERRLA, EOMI Nose: Nares, septum and mucosa are normal, no drainage or sinus tenderness Neck: No adenopathy, supple, symmetrical, trachea midline, no tenderness Resp: Clear to auscultation bilaterally, no wheezes/rales/rhonchi Cardio: Regular rate and rhythm, S1, S2 normal, no murmur, click, rub or gallop, no edema, right chest Port-A-Cath without signs of infection  Breasts:  Deferred GI: Soft, not distended, non-tender, hypoactive bowel sounds, no organomegaly Skin: No rashes/lesions, skin warm and dry, no erythematous areas, no cyanosis  M/S:  Atraumatic, normal strength in all extremities, normal range of motion, no clubbing  Lymph nodes: Cervical, supraclavicular, and  axillary nodes normal Neurologic: Grossly normal, cranial nerves II through XII intact, alert and oriented x 3 Psych: Appropriate affect   LABORATORY DATA: Lab Results  Component Value Date   WBC 12.2* 07/27/2013   HGB 10.6* 07/27/2013   HCT 31.6* 07/27/2013   MCV 86.8 07/27/2013   PLT 328 07/27/2013      Chemistry      Component Value Date/Time   NA 142 07/27/2013 1021   NA 137 06/23/2013 1522   K 4.4 07/27/2013 1021   K 4.2 06/23/2013 1522   CL 102 06/23/2013 1522   CO2 25 07/27/2013 1021   CO2 28 06/23/2013 1522   BUN 14.9 07/27/2013 1021   BUN 11 06/23/2013 1522   CREATININE 0.7 07/27/2013 1021   CREATININE 0.70 06/23/2013 1522      Component Value Date/Time   CALCIUM 9.6 07/27/2013 1021   CALCIUM 8.9 06/23/2013 1522   ALKPHOS 91 07/27/2013 1021   ALKPHOS 110 06/23/2013 1522   AST 17 07/27/2013 1021   AST 34 06/23/2013 1522   ALT 15 07/27/2013 1021    ALT 23 06/23/2013 1522   BILITOT 0.30 07/27/2013 1021   BILITOT 0.1* 06/23/2013 1522       RADIOGRAPHIC STUDIES: No results found.    ASSESSMENT: Vannessa Godown Barbeau is a 47 y.o. female with:  #1  For 3-4 months experienced a left nipple bloody discharge. She had a recent mammogram that did not show a mass. She had a physical examination which did show a mass measuring 3.4 cm at the 7 o'clock position. MRI on 03/24/2013 revealed the mass to be 5.0 cm. She went on to have a biopsy performed at the 7 o'clock position that showed invasive ductal carcinoma grade 1. Prognostic markers revealed the tumor to be estrogen receptor positive, progesterone receptor positive, HER-2/neu negative with a low Ki-67 11%.  #2 Status post left breast mastectomy on 04/13/2013. The final pathology revealed estrogen receptor positive, invasive ductal carcinoma with The tumor was sent for Oncotype testing, but an insufficient amount of tumor specimen meant testing could not be performed.  Patient's [place] was discussed again at the multidisciplinary breast conference. It was recommended by the team because there was significant amount of invasive disease, Mrs. Kohen should receive chemotherapy.  Dr. Welton Flakes discussed with the patient and her husband the role of chemotherapy.  #3  Adjuvant chemotherapy consisting of every 3 week Taxotere/Cytoxan x 4 cycles with granulocyte support with Neulasta injection on day 2 of each cycle began on 05/25/2013.  #4 Body aches.  #5 hypokalemia   PLAN:  #1 proceed with cycle 4 of chemotherapy today. She will return tomorrow for Neulasta injection.  #2 we once again discussed side effect management in great detail. We also discussed future plans in terms of further management. Her tumor was ER positive and she is premenopausal so therefore she will get tamoxifen. We can begin this in about 6-8 weeks.  #3 patient will return in one week's time for interim lab in followup  All questions  answered.  Mr. and Mrs. Bolle were encouraged to contact us in the interim with any questions, concerns, or problems.  Drue Second, MD Medical/Oncology [facility] [phone] (beeper) [phone] (Office)  07/27/2013, 11:25 AM

## 2013-07-28 ENCOUNTER — Ambulatory Visit (HOSPITAL_BASED_OUTPATIENT_CLINIC_OR_DEPARTMENT_OTHER): Payer: BC Managed Care – PPO

## 2013-07-28 VITALS — BP 133/84 | HR 84 | Temp 98.4°F

## 2013-07-28 DIAGNOSIS — C50519 Malignant neoplasm of lower-outer quadrant of unspecified female breast: Secondary | ICD-10-CM

## 2013-07-28 DIAGNOSIS — C50512 Malignant neoplasm of lower-outer quadrant of left female breast: Secondary | ICD-10-CM

## 2013-07-28 DIAGNOSIS — Z5189 Encounter for other specified aftercare: Secondary | ICD-10-CM

## 2013-07-28 MED ORDER — PEGFILGRASTIM INJECTION 6 MG/0.6ML
6.0000 mg | Freq: Once | SUBCUTANEOUS | Status: AC
  Administered 2013-07-28: 6 mg via SUBCUTANEOUS
  Filled 2013-07-28: qty 0.6

## 2013-08-03 ENCOUNTER — Encounter: Payer: Self-pay | Admitting: Adult Health

## 2013-08-03 ENCOUNTER — Other Ambulatory Visit (HOSPITAL_BASED_OUTPATIENT_CLINIC_OR_DEPARTMENT_OTHER): Payer: BC Managed Care – PPO

## 2013-08-03 ENCOUNTER — Other Ambulatory Visit: Payer: BC Managed Care – PPO | Admitting: Lab

## 2013-08-03 ENCOUNTER — Ambulatory Visit (HOSPITAL_BASED_OUTPATIENT_CLINIC_OR_DEPARTMENT_OTHER): Payer: BC Managed Care – PPO | Admitting: Adult Health

## 2013-08-03 ENCOUNTER — Ambulatory Visit: Payer: BC Managed Care – PPO | Admitting: Family

## 2013-08-03 VITALS — BP 135/82 | HR 97 | Temp 98.2°F | Resp 18 | Ht 65.0 in | Wt 150.0 lb

## 2013-08-03 DIAGNOSIS — C50512 Malignant neoplasm of lower-outer quadrant of left female breast: Secondary | ICD-10-CM

## 2013-08-03 DIAGNOSIS — C50919 Malignant neoplasm of unspecified site of unspecified female breast: Secondary | ICD-10-CM

## 2013-08-03 DIAGNOSIS — C50912 Malignant neoplasm of unspecified site of left female breast: Secondary | ICD-10-CM

## 2013-08-03 DIAGNOSIS — C50519 Malignant neoplasm of lower-outer quadrant of unspecified female breast: Secondary | ICD-10-CM

## 2013-08-03 LAB — CBC WITH DIFFERENTIAL/PLATELET
Basophils Absolute: 0.1 10*3/uL (ref 0.0–0.1)
Eosinophils Absolute: 0.2 10*3/uL (ref 0.0–0.5)
HCT: 36.5 % (ref 34.8–46.6)
HGB: 11.8 g/dL (ref 11.6–15.9)
MCH: 28.1 pg (ref 25.1–34.0)
MCV: 87.3 fL (ref 79.5–101.0)
MONO#: 2.1 10*3/uL — ABNORMAL HIGH (ref 0.1–0.9)
MONO%: 12.6 % (ref 0.0–14.0)
NEUT#: 12.9 10*3/uL — ABNORMAL HIGH (ref 1.5–6.5)
NEUT%: 75.9 % (ref 38.4–76.8)
RDW: 16.9 % — ABNORMAL HIGH (ref 11.2–14.5)
WBC: 17 10*3/uL — ABNORMAL HIGH (ref 3.9–10.3)
lymph#: 1.6 10*3/uL (ref 0.9–3.3)

## 2013-08-03 LAB — COMPREHENSIVE METABOLIC PANEL (CC13)
Albumin: 4.1 g/dL (ref 3.5–5.0)
Alkaline Phosphatase: 123 U/L (ref 40–150)
Anion Gap: 9 mEq/L (ref 3–11)
BUN: 8.2 mg/dL (ref 7.0–26.0)
CO2: 27 mEq/L (ref 22–29)
Creatinine: 0.7 mg/dL (ref 0.6–1.1)
Glucose: 87 mg/dl (ref 70–140)
Potassium: 4.4 mEq/L (ref 3.5–5.1)
Sodium: 141 mEq/L (ref 136–145)
Total Protein: 6.8 g/dL (ref 6.4–8.3)

## 2013-08-03 NOTE — Progress Notes (Addendum)
[facility]  Telephone:(336) [phone] Fax:(336) [phone]  OFFICE PROGRESS NOTE    ID: Amanda Coleman   DOB: 11-13-1965  MR#: [id number]  ONG#:[id number]   PCP: Freddy Finner, MD RAD ONC: Lonie Peak, M.D.  SU: Emelia Loron, M.D. SUAlan Ripper Sanger, D.O.   DIAGNOSIS: Amanda Coleman is a 47 y.o. 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 Patient was seen in the multidisciplinary clinic on 03/29/2013.   STAGE:  Breast cancer of lower-outer quadrant of left female breast  Primary site: Breast (Left)  Staging method: AJCC 7th Edition  Clinical: Stage IIA (T2, N0, cM0)  Summary: Stage IIA (T2, N0, cM0)   PRIOR THERAPY: #1  For 3-4 months experienced a left nipple bloody discharge. She had a recent mammogram that did not show a mass. She had a physical examination which did show a mass measuring 3.4 cm at the 7 o'clock position. MRI on 03/24/2013 revealed the mass to be 5.0 cm. She went on to have a biopsy performed at the 7 o'clock position that showed invasive ductal carcinoma grade 1. Prognostic markers revealed the tumor to be estrogen receptor positive, progesterone receptor positive, HER-2/neu negative with a low Ki-67 11%.  #2  Patient is now status post mastectomy as of 04/13/2013. The final pathology did reveal invasive ductal carcinoma with ductal carcinoma in situ. Tumor is ER positive. We sent her tumor for Oncotype testing, but insufficient amount of tumor specimen meant testing could not be performed.  Patient's [place] was discussed again at the multidisciplinary breast conference. It was recommended by the team because there was significant amount of invasive disease that she should get chemotherapy.  Dr. Welton Flakes then discussed with the patient and her husband role of chemotherapy.  #3 Dr. Welton Flakes and the patient discussed different chemotherapy options and decided upon Taxotere Cytoxan to be given every 3 weeks for a total  of 4 cycles.   CURRENT THERAPY: Adjuvant chemotherapy consisting of every 3 week Taxotere/Cytoxan x 4 cycles with granulocyte support with Neulasta injection on day 2 of each cycle.   INTERVAL HISTORY: Amanda Coleman is a 47 y.o. female who returns for followup visit after receiving her fourth and final cycle of adjuvant  Taxotere/Cytoxan.  She is doing well today.  She is fatigued following chemotherapy, and had one episode of diarrhea this morning that she took Imodium for.  She is otherwise well and denies fevers, chills, nausea, vomiting, constipation, numbness, or any further concerns.    MEDICAL HISTORY: Past Medical History  Diagnosis Date  . Thyroid disease   . Breast cancer   . Hashimoto's disease   . Hypothyroidism   . PONV (postoperative nausea and vomiting)   . GERD (gastroesophageal reflux disease)     ALLERGIES:   Allergies  Allergen Reactions  . Penicillins Swelling  . Sulfa Antibiotics Rash    MEDICATIONS:  Current Outpatient Prescriptions  Medication Sig Dispense Refill  . acetaminophen (TYLENOL) 500 MG tablet Take 500 mg by mouth every 6 (six) hours as needed for pain.      Marland Kitchen ALPRAZolam (XANAX) 0.5 MG tablet Take 0.5 mg by mouth as needed for sleep.      . diazepam (VALIUM) 2 MG tablet Take 2 mg by mouth daily as needed (for muscle spasms).       Marland Kitchen levothyroxine (SYNTHROID, LEVOTHROID) 112 MCG tablet Take 112 mcg by mouth daily before breakfast.      . zolpidem (AMBIEN)  10 MG tablet Take 10 mg by mouth at bedtime as needed for sleep.       No current facility-administered medications for this visit.    SURGICAL HISTORY:  Past Surgical History  Procedure Laterality Date  . Abdominal hysterectomy    . Gum graft    . Simple mastectomy with axillary sentinel node biopsy Left 04/13/2013    Dr Dwain Sarna  . Mastectomy w/ sentinel node biopsy Left 04/13/2013    Procedure: LEFT SKIN SPARING MASTECTOMY WITH LEFT AXILLARY SENTINEL NODE BIOPSY;  Surgeon: Emelia Loron, MD;  Location: [facility] OR;  Service: General;  Laterality: Left;  . Breast reconstruction with placement of tissue expander and flex hd (acellular hydrated dermis) Left 04/13/2013    Procedure: LEFT BREAST RECONSTRUCTION WITH PLACEMENT OF TISSUE EXPANDER AND FLEX HD (ACELLULAR HYDRATED DERMIS);  Surgeon: Wayland Denis, DO;  Location: [facility] OR;  Service: Plastics;  Laterality: Left;  . Portacath placement N/A 05/18/2013    Procedure: INSERTION PORT-A-CATH;  Surgeon: Emelia Loron, MD;  Location: [facility] ORS;  Service: General;  Laterality: N/A;    REVIEW OF SYSTEMS:  Pertinent items are noted in HPI.   A 10 point review of systems was completed and is negative except as noted above.  Amanda Coleman denies any other symptomatology including fatigue, fever or chills, headache, vision changes, swollen glands, cough or shortness of breath, chest pain or discomfort, vomiting,  constipation, change in urinary habits, any other arthralgias/myalgias, unusual bleeding/bruising or any other symptomatology.    PHYSICAL EXAMINATION: Blood pressure 135/82, pulse 97, temperature 98.2 F (36.8 C), temperature source Oral, resp. rate 18, height 5\' 5"  (1.651 m), weight 150 lb (68.04 kg). Body mass index is 24.96 kg/(m^2). General: Patient is a well appearing female in no acute distress HEENT: PERRLA, sclerae anicteric no conjunctival pallor, MMM Neck: supple, no palpable adenopathy Lungs: clear to auscultation bilaterally, no wheezes, rhonchi, or rales Cardiovascular: regular rate rhythm, S1, S2, no murmurs, rubs or gallops Abdomen: Soft, non-tender, non-distended, normoactive bowel sounds, no HSM Extremities: warm and well perfused, no clubbing, cyanosis, or edema Skin: No rashes or lesions Neuro: Non-focal ECOG PERFORMANCE STATUS: 1 - Symptomatic but completely ambulatory  LABORATORY DATA: Lab Results  Component Value Date   WBC 17.0* 08/03/2013   HGB 11.8 08/03/2013   HCT 36.5 08/03/2013   MCV 87.3  08/03/2013   PLT 231 08/03/2013      Chemistry      Component Value Date/Time   NA 141 08/03/2013 1140   NA 137 06/23/2013 1522   K 4.4 08/03/2013 1140   K 4.2 06/23/2013 1522   CL 102 06/23/2013 1522   CO2 27 08/03/2013 1140   CO2 28 06/23/2013 1522   BUN 8.2 08/03/2013 1140   BUN 11 06/23/2013 1522   CREATININE 0.7 08/03/2013 1140   CREATININE 0.70 06/23/2013 1522      Component Value Date/Time   CALCIUM 9.7 08/03/2013 1140   CALCIUM 8.9 06/23/2013 1522   ALKPHOS 123 08/03/2013 1140   ALKPHOS 110 06/23/2013 1522   AST 22 08/03/2013 1140   AST 34 06/23/2013 1522   ALT 17 08/03/2013 1140   ALT 23 06/23/2013 1522   BILITOT 0.56 08/03/2013 1140   BILITOT 0.1* 06/23/2013 1522       RADIOGRAPHIC STUDIES: No results found.    ASSESSMENT: Amanda Coleman is a 47 y.o. female with:  #1  For 3-4 months experienced a left nipple bloody discharge. She had a recent mammogram that did not  show a mass. She had a physical examination which did show a mass measuring 3.4 cm at the 7 o'clock position. MRI on 03/24/2013 revealed the mass to be 5.0 cm. She went on to have a biopsy performed at the 7 o'clock position that showed invasive ductal carcinoma grade 1. Prognostic markers revealed the tumor to be estrogen receptor positive, progesterone receptor positive, HER-2/neu negative with a low Ki-67 11%.  #2 Status post left breast mastectomy on 04/13/2013. The final pathology revealed estrogen receptor positive, invasive ductal carcinoma with The tumor was sent for Oncotype testing, but an insufficient amount of tumor specimen meant testing could not be performed.  Patient's [place] was discussed again at the multidisciplinary breast conference. It was recommended by the team because there was significant amount of invasive disease, Amanda Coleman should receive chemotherapy.  Dr. Welton Flakes discussed with the patient and her husband the role of chemotherapy.  #3  Adjuvant chemotherapy consisting of every 3 week  Taxotere/Cytoxan x 4 cycles with granulocyte support with Neulasta injection on day 2 of each cycle from 05/25/2013 through 07/27/13.  PLAN:  #1 Patient is doing well following chemotherapy.  Her CBC is stable, I reviewed it with her in detail.    #2 We discussed her ER positivity and she will start Tamoxifen after the holidays.  I gave her detailed information about Tamoxifen in her AVS.    #3 We will see her back in 6 weeks for labs and evaluation in order to start Tamoxifen. She is scheduled for genetic counseling/testing on 08/14/2013.  All questions answered.  Amanda Coleman was encouraged to contact us in the interim with any questions, concerns, or problems.  I spent 25 minutes counseling the patient face to face.  The total time spent in the appointment was 30 minutes.   Illa Level, NP Medical Oncology [facility] [phone] 08/03/2013, 12:32 PM     ATTENDING'S ATTESTATION:  I personally reviewed patient's chart, examined patient myself, formulated the treatment plan as followed.    Completed chemotherapy. Doing well, proceed with anti-estrogen therapy after the new years.riskks and benefits explained.  Drue Second, MD Medical/Oncology [facility] [phone] (beeper) [phone] (Office)  08/11/2013, 12:15 PM

## 2013-08-03 NOTE — Patient Instructions (Signed)

## 2013-08-04 ENCOUNTER — Ambulatory Visit: Payer: BC Managed Care – PPO | Admitting: Family

## 2013-08-04 ENCOUNTER — Other Ambulatory Visit: Payer: BC Managed Care – PPO | Admitting: Lab

## 2013-08-04 ENCOUNTER — Telehealth: Payer: Self-pay | Admitting: Oncology

## 2013-08-14 ENCOUNTER — Encounter: Payer: Self-pay | Admitting: Genetic Counselor

## 2013-08-14 ENCOUNTER — Encounter: Payer: Self-pay | Admitting: *Deleted

## 2013-08-14 ENCOUNTER — Other Ambulatory Visit: Payer: BC Managed Care – PPO

## 2013-08-14 ENCOUNTER — Ambulatory Visit (HOSPITAL_BASED_OUTPATIENT_CLINIC_OR_DEPARTMENT_OTHER): Payer: BC Managed Care – PPO | Admitting: Genetic Counselor

## 2013-08-14 ENCOUNTER — Other Ambulatory Visit: Payer: Self-pay | Admitting: *Deleted

## 2013-08-14 DIAGNOSIS — IMO0002 Reserved for concepts with insufficient information to code with codable children: Secondary | ICD-10-CM

## 2013-08-14 DIAGNOSIS — Z800 Family history of malignant neoplasm of digestive organs: Secondary | ICD-10-CM

## 2013-08-14 DIAGNOSIS — Z8049 Family history of malignant neoplasm of other genital organs: Secondary | ICD-10-CM

## 2013-08-14 DIAGNOSIS — C50512 Malignant neoplasm of lower-outer quadrant of left female breast: Secondary | ICD-10-CM

## 2013-08-14 DIAGNOSIS — Z8041 Family history of malignant neoplasm of ovary: Secondary | ICD-10-CM

## 2013-08-14 DIAGNOSIS — C50519 Malignant neoplasm of lower-outer quadrant of unspecified female breast: Secondary | ICD-10-CM

## 2013-08-14 DIAGNOSIS — C50919 Malignant neoplasm of unspecified site of unspecified female breast: Secondary | ICD-10-CM

## 2013-08-14 MED ORDER — TAMOXIFEN CITRATE 20 MG PO TABS: 20.0000 mg | ORAL_TABLET | Freq: Every day | ORAL | Status: DC

## 2013-08-14 NOTE — Progress Notes (Signed)
Met with pt and discuss post chemo care.  Pt relate she had not yet received her tamoxifen.  That Dr. Welton Flakes relayed that she had sent it to her pharmacy.  I informed the pt that I will call and check in to the medication.  I discussed side effects of tamoxifen.    I called in Tamoxifen as the escribe had not gone through.

## 2013-08-14 NOTE — Progress Notes (Signed)
Dr.  Drue Second requested a consultation for genetic counseling and risk assessment for Amanda Coleman, a 47 y.o. female, for discussion of her personal history of breast cancer and family history of pancreatic, uterine and ovarian cancer.  She presents to clinic today to discuss the possibility of a genetic predisposition to cancer, and to further clarify her risks, as well as her family members' risks for cancer.   HISTORY OF PRESENT ILLNESS: In August 2014, at the age of 76, Amanda Coleman was diagnosed with breast cancer. This was treated with unilateral mastectomy, chemotherapy but no radiation.  She is still waiting to hear about her tamoxifen.  She has never had a colonoscopy.  Jan was diagnosed with Hashimoto's thyroiditis at 79.  She denies any lipomas, or a previous diagnosis of fibrocystic breast disease.  She did have uterine fibroids for which she had a hysterectomy.   Past Medical History  Diagnosis Date  . Thyroid disease   . Breast cancer   . Hashimoto's disease   . Hypothyroidism   . PONV (postoperative nausea and vomiting)   . GERD (gastroesophageal reflux disease)     Past Surgical History  Procedure Laterality Date  . Abdominal hysterectomy    . Gum graft    . Simple mastectomy with axillary sentinel node biopsy Left 04/13/2013    Dr Dwain Sarna  . Mastectomy w/ sentinel node biopsy Left 04/13/2013    Procedure: LEFT SKIN SPARING MASTECTOMY WITH LEFT AXILLARY SENTINEL NODE BIOPSY;  Surgeon: Emelia Loron, MD;  Location: [facility] OR;  Service: General;  Laterality: Left;  . Breast reconstruction with placement of tissue expander and flex hd (acellular hydrated dermis) Left 04/13/2013    Procedure: LEFT BREAST RECONSTRUCTION WITH PLACEMENT OF TISSUE EXPANDER AND FLEX HD (ACELLULAR HYDRATED DERMIS);  Surgeon: Wayland Denis, DO;  Location: [facility] OR;  Service: Plastics;  Laterality: Left;  . Portacath placement N/A 05/18/2013    Procedure: INSERTION PORT-A-CATH;  Surgeon: Emelia Loron, MD;  Location: [facility] ORS;  Service: General;  Laterality: N/A;    History   Social History  . Marital Status: Married    Spouse Name: N/A    Number of Children: 0  . Years of Education: N/A   Social History Main Topics  . Smoking status: Never Smoker   . Smokeless tobacco: Never Used  . Alcohol Use: No  . Drug Use: No  . Sexual Activity: Yes    Birth Control/ Protection: Surgical     Comment: Hysterectomy   Other Topics Concern  . None   Social History Narrative  . None    REPRODUCTIVE HISTORY AND PERSONAL RISK ASSESSMENT FACTORS: perimenopausal Uterus Intact: no Ovaries Intact: yes [id number], first live birth at age N/A  She has not previously undergone treatment for infertility.   Oral Contraceptive use: 13 years   She has not used HRT in the past.    FAMILY HISTORY:  We obtained a detailed, 4-generation family history.  Significant diagnoses are listed below: Family History  Problem Relation Age of Onset  . Pancreatic cancer Father 28  . Lung cancer Maternal Aunt     smoker  . Uterine cancer Maternal Grandmother     dx in her 3s  . Ovarian cancer Maternal Grandmother     dx in her 8s    Patient's maternal ancestors are of unknown descent, and paternal ancestors are of [place] descent. There is no reported Ashkenazi Jewish ancestry. There is no known consanguinity.  GENETIC COUNSELING ASSESSMENT:  Amanda Coleman is a 47 y.o. female with a personal history of bresat cancer and a family history of pancreatic, ovarian and uterine cancer which somewhat suggestive of a hereditary cancer syndrome and predisposition to cancer. We, therefore, discussed and recommended the following at today's visit.   DISCUSSION: We reviewed the characteristics, features and inheritance patterns of hereditary cancer syndromes. We also discussed genetic testing, including the appropriate family members to test, the process of testing, insurance coverage and turn-around-time for  results.   PLAN: After considering the risks, benefits, and limitations, Matraca Hunkins Rizo provided informed consent to pursue genetic testing and the blood sample will be sent to [facility] for analysis of the Breast/Ovarian cancer panel. We discussed the implications of a positive, negative and/ or variant of uncertain significance genetic test result. Results should be available within approximately 3 weeks' time, at which point they will be disclosed by telephone to [name], as will any additional recommendations warranted by these results. [name] will receive a summary of her genetic counseling visit and a copy of her results once available. This information will also be available in Epic. We encouraged [name] to remain in contact with cancer genetics annually so that we can continuously update the family history and inform her of any changes in cancer genetics and testing that may be of benefit for her family. [name]'s questions were answered to her satisfaction today. Our contact information was provided should additional questions or concerns arise.  The patient was seen for a total of 60 minutes, greater than 50% of which was spent face-to-face counseling.  This note will also be sent to the referring provider via the electronic medical record. The patient will be supplied with a summary of this genetic counseling discussion as well as educational information on the discussed hereditary cancer syndromes following the conclusion of their visit.   Patient was discussed with Dr. Drue Second.   _______________________________________________________________________ For Office Staff:  Number of people involved in session: 2 Was an Intern/ student involved with [employer]: yes

## 2013-08-18 ENCOUNTER — Other Ambulatory Visit: Payer: BC Managed Care – PPO | Admitting: Lab

## 2013-08-22 ENCOUNTER — Encounter (HOSPITAL_BASED_OUTPATIENT_CLINIC_OR_DEPARTMENT_OTHER): Payer: Self-pay | Admitting: *Deleted

## 2013-08-22 NOTE — Progress Notes (Signed)
No more labs needed- 

## 2013-08-23 ENCOUNTER — Other Ambulatory Visit: Payer: Self-pay | Admitting: Plastic Surgery

## 2013-08-23 DIAGNOSIS — S21002S Unspecified open wound of left breast, sequela: Secondary | ICD-10-CM

## 2013-08-24 ENCOUNTER — Encounter (HOSPITAL_BASED_OUTPATIENT_CLINIC_OR_DEPARTMENT_OTHER): Payer: BC Managed Care – PPO | Admitting: Anesthesiology

## 2013-08-24 ENCOUNTER — Encounter (HOSPITAL_BASED_OUTPATIENT_CLINIC_OR_DEPARTMENT_OTHER): Payer: Self-pay | Admitting: Plastic Surgery

## 2013-08-24 ENCOUNTER — Ambulatory Visit (HOSPITAL_BASED_OUTPATIENT_CLINIC_OR_DEPARTMENT_OTHER): Payer: BC Managed Care – PPO | Admitting: Anesthesiology

## 2013-08-24 ENCOUNTER — Encounter (HOSPITAL_BASED_OUTPATIENT_CLINIC_OR_DEPARTMENT_OTHER)
Admission: RE | Disposition: A | Discharge status: Home / Self Care | Payer: Self-pay | Source: Ambulatory Visit | Attending: Plastic Surgery

## 2013-08-24 ENCOUNTER — Ambulatory Visit (HOSPITAL_BASED_OUTPATIENT_CLINIC_OR_DEPARTMENT_OTHER)
Admission: RE | Admit: 2013-08-24 | Discharge: 2013-08-24 | Disposition: A | Discharge status: Home / Self Care | Payer: BC Managed Care – PPO | Source: Ambulatory Visit | Attending: Plastic Surgery | Admitting: Plastic Surgery

## 2013-08-24 DIAGNOSIS — Y836 Removal of other organ (partial) (total) as the cause of abnormal reaction of the patient, or of later complication, without mention of misadventure at the time of the procedure: Secondary | ICD-10-CM | POA: Insufficient documentation

## 2013-08-24 DIAGNOSIS — S21002S Unspecified open wound of left breast, sequela: Secondary | ICD-10-CM

## 2013-08-24 DIAGNOSIS — S21009A Unspecified open wound of unspecified breast, initial encounter: Secondary | ICD-10-CM

## 2013-08-24 DIAGNOSIS — Z853 Personal history of malignant neoplasm of breast: Secondary | ICD-10-CM | POA: Insufficient documentation

## 2013-08-24 DIAGNOSIS — K219 Gastro-esophageal reflux disease without esophagitis: Secondary | ICD-10-CM | POA: Insufficient documentation

## 2013-08-24 DIAGNOSIS — T8189XA Other complications of procedures, not elsewhere classified, initial encounter: Secondary | ICD-10-CM | POA: Insufficient documentation

## 2013-08-24 DIAGNOSIS — Z9010 Acquired absence of unspecified breast and nipple: Secondary | ICD-10-CM | POA: Insufficient documentation

## 2013-08-24 HISTORY — PX: MASS EXCISION: SHX2000

## 2013-08-24 LAB — POCT HEMOGLOBIN-HEMACUE: HEMOGLOBIN: 10.9 g/dL — AB (ref 12.0–15.0)

## 2013-08-24 SURGERY — EXCISION MASS
Anesthesia: General | Site: Breast | Laterality: Left

## 2013-08-24 MED ORDER — OXYCODONE HCL 5 MG PO TABS: 5.0000 mg | ORAL_TABLET | Freq: Once | ORAL | Status: DC | PRN

## 2013-08-24 MED ORDER — FENTANYL CITRATE 0.05 MG/ML IJ SOLN
INTRAMUSCULAR | Status: AC
  Filled 2013-08-24: qty 6

## 2013-08-24 MED ORDER — MIDAZOLAM HCL 2 MG/2ML IJ SOLN: 1.0000 mg | INTRAMUSCULAR | Status: DC | PRN

## 2013-08-24 MED ORDER — PROPOFOL 10 MG/ML IV BOLUS
INTRAVENOUS | Status: DC | PRN
  Administered 2013-08-24: 150 mg via INTRAVENOUS

## 2013-08-24 MED ORDER — SODIUM CHLORIDE 0.9 % IR SOLN
Status: DC | PRN
  Administered 2013-08-24: 14:00:00

## 2013-08-24 MED ORDER — SCOPOLAMINE 1 MG/3DAYS TD PT72
MEDICATED_PATCH | TRANSDERMAL | Status: AC
  Filled 2013-08-24: qty 1

## 2013-08-24 MED ORDER — BUPIVACAINE-EPINEPHRINE PF 0.25-1:200000 % IJ SOLN
INTRAMUSCULAR | Status: AC
  Filled 2013-08-24: qty 30

## 2013-08-24 MED ORDER — LIDOCAINE-EPINEPHRINE 1 %-1:100000 IJ SOLN
INTRAMUSCULAR | Status: AC
  Filled 2013-08-24: qty 1

## 2013-08-24 MED ORDER — CIPROFLOXACIN IN D5W 400 MG/200ML IV SOLN
INTRAVENOUS | Status: AC
  Filled 2013-08-24: qty 200

## 2013-08-24 MED ORDER — CIPROFLOXACIN IN D5W 400 MG/200ML IV SOLN
400.0000 mg | INTRAVENOUS | Status: AC
  Administered 2013-08-24 (×2): 400 mg via INTRAVENOUS

## 2013-08-24 MED ORDER — OXYCODONE HCL 5 MG/5ML PO SOLN: 5.0000 mg | Freq: Once | ORAL | Status: AC | PRN

## 2013-08-24 MED ORDER — OXYCODONE HCL 5 MG PO TABS
ORAL_TABLET | ORAL | Status: AC
  Filled 2013-08-24: qty 1

## 2013-08-24 MED ORDER — MIDAZOLAM HCL 5 MG/5ML IJ SOLN
INTRAMUSCULAR | Status: DC | PRN
  Administered 2013-08-24: 2 mg via INTRAVENOUS

## 2013-08-24 MED ORDER — DEXAMETHASONE SODIUM PHOSPHATE 4 MG/ML IJ SOLN
INTRAMUSCULAR | Status: DC | PRN
  Administered 2013-08-24: 10 mg via INTRAVENOUS

## 2013-08-24 MED ORDER — HYDROMORPHONE HCL PF 1 MG/ML IJ SOLN
0.2500 mg | INTRAMUSCULAR | Status: DC | PRN
  Administered 2013-08-24: 0.5 mg via INTRAVENOUS
  Administered 2013-08-24: 0.25 mg via INTRAVENOUS

## 2013-08-24 MED ORDER — FENTANYL CITRATE 0.05 MG/ML IJ SOLN
INTRAMUSCULAR | Status: DC | PRN
  Administered 2013-08-24: 100 ug via INTRAVENOUS

## 2013-08-24 MED ORDER — SCOPOLAMINE 1 MG/3DAYS TD PT72
1.0000 | MEDICATED_PATCH | TRANSDERMAL | Status: DC
  Administered 2013-08-24: 1.5 mg via TRANSDERMAL

## 2013-08-24 MED ORDER — OXYCODONE HCL 5 MG PO TABS
5.0000 mg | ORAL_TABLET | Freq: Once | ORAL | Status: AC | PRN
  Administered 2013-08-24: 5 mg via ORAL

## 2013-08-24 MED ORDER — LACTATED RINGERS IV SOLN
INTRAVENOUS | Status: DC
  Administered 2013-08-24: 12:00:00 via INTRAVENOUS
  Administered 2013-08-24: 20 mL/h via INTRAVENOUS

## 2013-08-24 MED ORDER — LIDOCAINE HCL (CARDIAC) 20 MG/ML IV SOLN
INTRAVENOUS | Status: DC | PRN
  Administered 2013-08-24: 50 mg via INTRAVENOUS

## 2013-08-24 MED ORDER — MIDAZOLAM HCL 2 MG/2ML IJ SOLN
INTRAMUSCULAR | Status: AC
  Filled 2013-08-24: qty 2

## 2013-08-24 MED ORDER — HYDROMORPHONE HCL PF 1 MG/ML IJ SOLN: 0.2500 mg | INTRAMUSCULAR | Status: DC | PRN

## 2013-08-24 MED ORDER — ONDANSETRON HCL 4 MG/2ML IJ SOLN: 4.0000 mg | Freq: Once | INTRAMUSCULAR | Status: DC | PRN

## 2013-08-24 MED ORDER — OXYCODONE HCL 5 MG/5ML PO SOLN: 5.0000 mg | Freq: Once | ORAL | Status: DC | PRN

## 2013-08-24 MED ORDER — FENTANYL CITRATE 0.05 MG/ML IJ SOLN: 50.0000 ug | INTRAMUSCULAR | Status: DC | PRN

## 2013-08-24 MED ORDER — LACTATED RINGERS IV SOLN: INTRAVENOUS | Status: DC

## 2013-08-24 MED ORDER — HYDROMORPHONE HCL PF 1 MG/ML IJ SOLN
INTRAMUSCULAR | Status: AC
  Filled 2013-08-24: qty 1

## 2013-08-24 MED ORDER — BACITRACIN ZINC 500 UNIT/GM EX OINT
TOPICAL_OINTMENT | CUTANEOUS | Status: AC
  Filled 2013-08-24: qty 28.35

## 2013-08-24 MED ORDER — ONDANSETRON HCL 4 MG/2ML IJ SOLN
INTRAMUSCULAR | Status: DC | PRN
  Administered 2013-08-24: 4 mg via INTRAVENOUS

## 2013-08-24 SURGICAL SUPPLY — 72 items
BENZOIN TINCTURE PRP APPL 2/3 (GAUZE/BANDAGES/DRESSINGS) IMPLANT
BINDER BREAST LRG (GAUZE/BANDAGES/DRESSINGS) ×3 IMPLANT
BLADE SURG 15 STRL LF DISP TIS (BLADE) ×1 IMPLANT
BLADE SURG 15 STRL SS (BLADE) ×2
BLADE SURG ROTATE 9660 (MISCELLANEOUS) IMPLANT
BNDG CMPR MD 5X2 ELC HKLP STRL (GAUZE/BANDAGES/DRESSINGS)
BNDG CONFORM 2 STRL LF (GAUZE/BANDAGES/DRESSINGS) IMPLANT
BNDG ELASTIC 2 VLCR STRL LF (GAUZE/BANDAGES/DRESSINGS) IMPLANT
CANISTER SUCT 1200ML W/VALVE (MISCELLANEOUS) ×3 IMPLANT
CHLORAPREP W/TINT 26ML (MISCELLANEOUS) IMPLANT
CLEANER CAUTERY TIP 5X5 PAD (MISCELLANEOUS) IMPLANT
CLOSURE WOUND 1/2 X4 (GAUZE/BANDAGES/DRESSINGS)
CORDS BIPOLAR (ELECTRODE) IMPLANT
COVER MAYO STAND STRL (DRAPES) ×3 IMPLANT
COVER TABLE BACK 60X90 (DRAPES) ×3 IMPLANT
DECANTER SPIKE VIAL GLASS SM (MISCELLANEOUS) IMPLANT
DERMABOND ADVANCED (GAUZE/BANDAGES/DRESSINGS) ×4
DERMABOND ADVANCED .7 DNX12 (GAUZE/BANDAGES/DRESSINGS) ×2 IMPLANT
DRAPE PED LAPAROTOMY (DRAPES) ×3 IMPLANT
DRAPE U-SHAPE 76X120 STRL (DRAPES) ×3 IMPLANT
DRSG TEGADERM 2-3/8X2-3/4 SM (GAUZE/BANDAGES/DRESSINGS) IMPLANT
DRSG TEGADERM 4X4.75 (GAUZE/BANDAGES/DRESSINGS) IMPLANT
ELECT COATED BLADE 2.86 ST (ELECTRODE) IMPLANT
ELECT NEEDLE BLADE 2-5/6 (NEEDLE) IMPLANT
ELECT REM PT RETURN 9FT ADLT (ELECTROSURGICAL) ×3
ELECT REM PT RETURN 9FT PED (ELECTROSURGICAL)
ELECTRODE REM PT RETRN 9FT PED (ELECTROSURGICAL) IMPLANT
ELECTRODE REM PT RTRN 9FT ADLT (ELECTROSURGICAL) ×1 IMPLANT
GLOVE BIO SURGEON STRL SZ 6.5 (GLOVE) ×2 IMPLANT
GLOVE BIO SURGEONS STRL SZ 6.5 (GLOVE) ×1
GLOVE SURG SS PI 7.0 STRL IVOR (GLOVE) ×3 IMPLANT
GOWN STRL REUS W/ TWL LRG LVL3 (GOWN DISPOSABLE) ×2 IMPLANT
GOWN STRL REUS W/TWL LRG LVL3 (GOWN DISPOSABLE) ×4
NEEDLE 27GAX1X1/2 (NEEDLE) IMPLANT
NEEDLE HYPO 30GX1 BEV (NEEDLE) IMPLANT
NS IRRIG 1000ML POUR BTL (IV SOLUTION) IMPLANT
PACK BASIN DAY SURGERY FS (CUSTOM PROCEDURE TRAY) ×3 IMPLANT
PAD ABD 8X10 STRL (GAUZE/BANDAGES/DRESSINGS) ×3 IMPLANT
PAD CLEANER CAUTERY TIP 5X5 (MISCELLANEOUS)
PENCIL BUTTON HOLSTER BLD 10FT (ELECTRODE) ×3 IMPLANT
RUBBERBAND STERILE (MISCELLANEOUS) IMPLANT
SHEET MEDIUM DRAPE 40X70 STRL (DRAPES) IMPLANT
SLEEVE SCD COMPRESS KNEE MED (MISCELLANEOUS) ×3 IMPLANT
SPONGE GAUZE 2X2 8PLY STER LF (GAUZE/BANDAGES/DRESSINGS)
SPONGE GAUZE 2X2 8PLY STRL LF (GAUZE/BANDAGES/DRESSINGS) IMPLANT
SPONGE GAUZE 4X4 12PLY STER LF (GAUZE/BANDAGES/DRESSINGS) IMPLANT
SPONGE LAP 18X18 X RAY DECT (DISPOSABLE) ×3 IMPLANT
STRIP CLOSURE SKIN 1/2X4 (GAUZE/BANDAGES/DRESSINGS) IMPLANT
SUCTION FRAZIER TIP 10 FR DISP (SUCTIONS) IMPLANT
SUT MNCRL 6-0 UNDY P1 1X18 (SUTURE) IMPLANT
SUT MNCRL AB 3-0 PS2 18 (SUTURE) IMPLANT
SUT MNCRL AB 4-0 PS2 18 (SUTURE) ×6 IMPLANT
SUT MON AB 5-0 P3 18 (SUTURE) IMPLANT
SUT MON AB 5-0 PS2 18 (SUTURE) ×3 IMPLANT
SUT MONOCRYL 6-0 P1 1X18 (SUTURE)
SUT PROLENE 5 0 P 3 (SUTURE) IMPLANT
SUT PROLENE 5 0 PS 2 (SUTURE) IMPLANT
SUT PROLENE 6 0 P 1 18 (SUTURE) IMPLANT
SUT VIC AB 3-0 SH 27 (SUTURE) ×2
SUT VIC AB 3-0 SH 27X BRD (SUTURE) ×1 IMPLANT
SUT VIC AB 5-0 P-3 18X BRD (SUTURE) IMPLANT
SUT VIC AB 5-0 P3 18 (SUTURE)
SUT VIC AB 5-0 PS2 18 (SUTURE) IMPLANT
SUT VICRYL 4-0 PS2 18IN ABS (SUTURE) IMPLANT
SYR BULB 3OZ (MISCELLANEOUS) IMPLANT
SYR BULB IRRIGATION 50ML (SYRINGE) ×3 IMPLANT
SYR CONTROL 10ML LL (SYRINGE) ×3 IMPLANT
TOWEL OR 17X24 6PK STRL BLUE (TOWEL DISPOSABLE) ×3 IMPLANT
TRAY DSU PREP LF (CUSTOM PROCEDURE TRAY) ×3 IMPLANT
TUBE CONNECTING 20'X1/4 (TUBING) ×1
TUBE CONNECTING 20X1/4 (TUBING) ×2 IMPLANT
YANKAUER SUCT BULB TIP NO VENT (SUCTIONS) ×3 IMPLANT

## 2013-08-24 NOTE — Interval H&P Note (Signed)
History and Physical Interval Note:  08/24/2013 12:06 PM  Court Joy Egloff  has presented today for surgery, with the diagnosis of NON HEALING SURGICAL WOUND OF LEFT BREAST HX OF BREAST CANCER   The various methods of treatment have been discussed with the patient and family. After consideration of risks, benefits and other options for treatment, the patient has consented to  Procedure(s): EXCISION OF WOUND LEFT BREAST WITH CLOSURE  (Left) as a surgical intervention .  The patient's history has been reviewed, patient examined, no change in status, stable for surgery.  I have reviewed the patient's chart and labs.  Questions were answered to the patient's satisfaction.     SANGER,CLAIRE

## 2013-08-24 NOTE — H&P (Signed)
Amanda Coleman  08/22/2013 11:00 AM   Office Visit  MRN:  [id number]  Department: [facility]  Dept Phone: [phone]  Description: Female DOB: [birth year]  Provider: [name], DO     Diagnoses    Acquired absence of breast, left       V45.71    Breast cancer, left ([place])       174.9      Reason for Visit -  Breast Reconstruction      Vitals - Last Recorded      132/78  89  1.651 m ('5\' 5"' )  64.864 kg (143 lb)  23.80 kg/m2  100%     Subjective:   Patient ID: Amanda Coleman is a 48 y.o. female.  HPI The patient is a 48 yrs old female here for follow up for left mastectomy with immediate expander placement.   History:  She was having bloody nipple drainage from her left breast and was due for a mammagram.  She palpated a mass but it did no show on the mammagram.  The ultrasound showed a 3.4 x 0.9 x 1.7 cm mass and the MRI showed a 5 x 3.3 x 2.6 cm area of asymmetric enhancement but no real mass. No abnormal nodes or right breast anomalies were noted.  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 She has thyroid disease and has undergone an abdominal hysterectomy and gum graft.  She is not a smoker.  She is 5 feet 5 inches tall, weighs 137 pounds and wears a 34B bra. She would like to be around the same size.  She does not have a family history of breast cancer and is not going to have genetic testing.  The left breast is unchanged from last week. Acell was reapplied . Dr. Migdalia Dk has recommended excision of the ulcer in the OR later this week and the patient is agreeable pending insurance approval.    The following portions of the patient's history were reviewed and updated as appropriate: allergies, current medications, past family history, past medical history, past social history, past surgical history and problem list.  Review of Systems  All other systems reviewed and are negative.     Objective:    Physical Exam   Constitutional: She is oriented to person, place, and time. She appears well-developed and well-nourished. No distress.  HENT:   Head: Normocephalic and atraumatic.  Eyes: EOM are normal. Pupils are equal, round, and reactive to light.  Neck: Normal range of motion. Neck supple.  Cardiovascular: Normal rate, regular rhythm and intact distal pulses.   Pulmonary/Chest: Effort normal and breath sounds normal. No respiratory distress.  Abdominal: Soft. She exhibits no distension and no mass. There is no tenderness.  Neurological: She is alert and oriented to person, place, and time. No cranial nerve deficit. She exhibits normal muscle tone. Coordination normal.  Skin: Skin is warm and dry. No rash noted. No erythema.  Psychiatric: She has a normal mood and affect. Her behavior is normal. Judgment and thought content normal.      Assessment:   1.  Acquired absence of breast, left    2.  Breast cancer, left [facility])        Plan:   Dr. Migdalia Dk has recommended excision of the ulcer in the OR later this week and the patient is agreeable pending insurance approval.  Medications Ordered This Encounter     HYDROcodone-acetaminophen (NORCO) 5-325 mg per tablet  Take 1 tablet by mouth every 6 (six) hours as needed for up to 10 days for Pain.     minocycline (MINOCIN) 100 MG capsule  Take 1 capsule (100 mg total) by mouth 2 times daily.

## 2013-08-24 NOTE — Anesthesia Postprocedure Evaluation (Signed)
  Anesthesia Post-op Note  Patient: Amanda Coleman  Procedure(s) Performed: Procedure(s): EXCISION OF WOUND LEFT BREAST WITH CLOSURE  (Left)  Patient Location: PACU  Anesthesia Type:General  Level of Consciousness: awake and alert   Airway and Oxygen Therapy: Patient Spontanous Breathing  Post-op Pain: mild  Post-op Assessment: Post-op Vital signs reviewed  Post-op Vital Signs: stable  Complications: No apparent anesthesia complications

## 2013-08-24 NOTE — Discharge Instructions (Addendum)
May shower starting tomorrow. Continue sports bra or binder.  Call your surgeon if you experience:   1.  Fever over 101.0. 2.  Inability to urinate. 3.  Nausea and/or vomiting. 4.  Extreme swelling or bruising at the surgical site. 5.  Continued bleeding from the incision. 6.  Increased pain, redness or drainage from the incision. 7.  Problems related to your pain medication.  Post Anesthesia Home Care Instructions  Activity: Get plenty of rest for the remainder of the day. A responsible adult should stay with you for 24 hours following the procedure.  For the next 24 hours, DO NOT: -Drive a car -Paediatric nurse -Drink alcoholic beverages -Take any medication unless instructed by your physician -Make any legal decisions or sign important papers.  Meals: Start with liquid foods such as gelatin or soup. Progress to regular foods as tolerated. Avoid greasy, spicy, heavy foods. If nausea and/or vomiting occur, drink only clear liquids until the nausea and/or vomiting subsides. Call your physician if vomiting continues.  Special Instructions/Symptoms: Your throat may feel dry or sore from the anesthesia or the breathing tube placed in your throat during surgery. If this causes discomfort, gargle with warm salt water. The discomfort should disappear within 24 hours.

## 2013-08-24 NOTE — Anesthesia Preprocedure Evaluation (Signed)
Anesthesia Evaluation  Patient identified by MRN, date of birth, ID band Patient awake    Reviewed: Allergy & Precautions, H&P , NPO status , Patient's Chart, lab work & pertinent test results  History of Anesthesia Complications (+) PONV  Airway Mallampati: II TM Distance: >3 FB Neck ROM: Full    Dental  (+) Teeth Intact and Dental Advisory Given   Pulmonary  breath sounds clear to auscultation        Cardiovascular Rhythm:Regular Rate:Normal     Neuro/Psych    GI/Hepatic GERD-  Medicated and Controlled,  Endo/Other    Renal/GU      Musculoskeletal   Abdominal   Peds  Hematology   Anesthesia Other Findings   Reproductive/Obstetrics                           Anesthesia Physical Anesthesia Plan  ASA: II  Anesthesia Plan: General   Post-op Pain Management:    Induction: Intravenous  Airway Management Planned: LMA  Additional Equipment:   Intra-op Plan:   Post-operative Plan: Extubation in OR  Informed Consent: I have reviewed the patients History and Physical, chart, labs and discussed the procedure including the risks, benefits and alternatives for the proposed anesthesia with the patient or authorized representative who has indicated his/her understanding and acceptance.   Dental advisory given  Plan Discussed with: CRNA, Anesthesiologist and Surgeon  Anesthesia Plan Comments:         Anesthesia Quick Evaluation

## 2013-08-24 NOTE — Transfer of Care (Signed)
Immediate Anesthesia Transfer of Care Note  Patient: Amanda Coleman  Procedure(s) Performed: Procedure(s): EXCISION OF WOUND LEFT BREAST WITH CLOSURE  (Left)  Patient Location: PACU  Anesthesia Type:General  Level of Consciousness: sedated  Airway & Oxygen Therapy: Patient Spontanous Breathing and Patient connected to face mask oxygen  Post-op Assessment: Report given to PACU RN and Post -op Vital signs reviewed and stable  Post vital signs: Reviewed and stable  Complications: No apparent anesthesia complications

## 2013-08-24 NOTE — Op Note (Signed)
Operative Note   DATE OF OPERATION: 08/24/2013  LOCATION: [place]  SURGICAL DIVISION: Plastic Surgery  PREOPERATIVE DIAGNOSES:  Left breast wound  POSTOPERATIVE DIAGNOSES:  same  PROCEDURE:  Excision of left breast wound with primary closure 2 cm  SURGEON: [name], DO  ASSISTANT: Shawn Rayburn, PA  ANESTHESIA:  General.   COMPLICATIONS: None.   INDICATIONS FOR PROCEDURE:  The patient, Amanda Coleman, is a 48 y.o. female born on 1966/07/21, is here for treatment of a left breast wound.   CONSENT:  Informed consent was obtained directly from the patient. Risks, benefits and alternatives were fully discussed. Specific risks including but not limited to bleeding, infection, hematoma, seroma, scarring, pain, implant infection, implant extrusion, capsular contracture, asymmetry, wound healing problems, and need for further surgery were all discussed. The patient did have an ample opportunity to have questions answered to satisfaction.   DESCRIPTION OF PROCEDURE:  The patient was taken to the operating room. SCDs were placed and IV antibiotics were given. The patient's operative site was prepped and draped in a sterile fashion. A time out was performed and all information was confirmed to be correct.  General anesthesia was administered.  The skin was marked for an eliptical excision and excised at the site of the defect with the #15 blade.  The implant was completely covered with the muscle.  There was no purulence noted.  Cultures were obtained.  The pocket was irrigated with antibiotic solution.  The deep layers were closed with 3-0 Vicryl followed by 4-0 Monocryl and 5-0 Monocryl. Dermabond was applied.  A breast binder and ABDs were placed.  The patient tolerated the procedure well.  The patient was allowed to wake from anesthesia, extubated and taken to the recovery room in satisfactory condition.

## 2013-08-24 NOTE — Brief Op Note (Signed)
08/24/2013  2:52 PM  PATIENT:  Amanda Coleman  48 y.o. female  PRE-OPERATIVE DIAGNOSIS:  NON HEALING SURGICAL WOUND OF LEFT BREAST HX OF BREAST CANCER   POST-OPERATIVE DIAGNOSIS:  NON HEALING SURGICAL WOUND OF LEFT BREAST HX OF BREAST CANCER   PROCEDURE:  Procedure(s): EXCISION OF WOUND LEFT BREAST WITH CLOSURE  (Left)  SURGEON:  Surgeon(s) and Role:    * Deroy Noah Sanger, DO - Primary  PHYSICIAN ASSISTANT: none  ASSISTANTS: none   ANESTHESIA:   general  EBL:  Total I/O In: 750 [I.V.:750] Out: -   BLOOD ADMINISTERED:none  DRAINS: none   LOCAL MEDICATIONS USED:  NONE  SPECIMEN:  Source of Specimen:  micro for cultures  DISPOSITION OF SPECIMEN:  micro  COUNTS:  YES  TOURNIQUET:  * No tourniquets in log *  DICTATION: .Dragon Dictation  PLAN OF CARE: Discharge to home after PACU  PATIENT DISPOSITION:  PACU - hemodynamically stable.   Delay start of Pharmacological VTE agent (>24hrs) due to surgical blood loss or risk of bleeding: no

## 2013-08-25 ENCOUNTER — Encounter (HOSPITAL_BASED_OUTPATIENT_CLINIC_OR_DEPARTMENT_OTHER): Payer: Self-pay | Admitting: Plastic Surgery

## 2013-08-27 LAB — WOUND CULTURE

## 2013-08-28 ENCOUNTER — Encounter: Payer: Self-pay | Admitting: Genetic Counselor

## 2013-08-28 ENCOUNTER — Telehealth: Payer: Self-pay | Admitting: Genetic Counselor

## 2013-08-28 NOTE — Telephone Encounter (Signed)
Revealed negative genetic testing.    

## 2013-08-29 LAB — ANAEROBIC CULTURE

## 2013-09-12 ENCOUNTER — Ambulatory Visit (HOSPITAL_BASED_OUTPATIENT_CLINIC_OR_DEPARTMENT_OTHER): Payer: BC Managed Care – PPO | Admitting: Oncology

## 2013-09-12 ENCOUNTER — Encounter: Payer: Self-pay | Admitting: Oncology

## 2013-09-12 ENCOUNTER — Telehealth: Payer: Self-pay | Admitting: Oncology

## 2013-09-12 ENCOUNTER — Other Ambulatory Visit (HOSPITAL_BASED_OUTPATIENT_CLINIC_OR_DEPARTMENT_OTHER): Payer: BC Managed Care – PPO

## 2013-09-12 VITALS — BP 137/76 | HR 74 | Temp 98.9°F | Resp 18 | Ht 65.0 in | Wt 153.9 lb

## 2013-09-12 DIAGNOSIS — C50512 Malignant neoplasm of lower-outer quadrant of left female breast: Secondary | ICD-10-CM

## 2013-09-12 DIAGNOSIS — C50319 Malignant neoplasm of lower-inner quadrant of unspecified female breast: Secondary | ICD-10-CM

## 2013-09-12 DIAGNOSIS — C50919 Malignant neoplasm of unspecified site of unspecified female breast: Secondary | ICD-10-CM

## 2013-09-12 DIAGNOSIS — Z170 Estrogen receptor positive status [ER+]: Secondary | ICD-10-CM

## 2013-09-12 DIAGNOSIS — C50912 Malignant neoplasm of unspecified site of left female breast: Secondary | ICD-10-CM

## 2013-09-12 LAB — CBC WITH DIFFERENTIAL/PLATELET
BASO%: 3.5 % — ABNORMAL HIGH (ref 0.0–2.0)
Basophils Absolute: 0.2 10*3/uL — ABNORMAL HIGH (ref 0.0–0.1)
EOS%: 3.5 % (ref 0.0–7.0)
Eosinophils Absolute: 0.2 10*3/uL (ref 0.0–0.5)
HCT: 35.7 % (ref 34.8–46.6)
HGB: 11.8 g/dL (ref 11.6–15.9)
LYMPH#: 1.2 10*3/uL (ref 0.9–3.3)
LYMPH%: 27.9 % (ref 14.0–49.7)
MCH: 29.4 pg (ref 25.1–34.0)
MCHC: 32.9 g/dL (ref 31.5–36.0)
MCV: 89.2 fL (ref 79.5–101.0)
MONO#: 0.4 10*3/uL (ref 0.1–0.9)
MONO%: 8.4 % (ref 0.0–14.0)
NEUT#: 2.5 10*3/uL (ref 1.5–6.5)
NEUT%: 56.7 % (ref 38.4–76.8)
Platelets: 273 10*3/uL (ref 145–400)
RBC: 4 10*6/uL (ref 3.70–5.45)
RDW: 15 % — ABNORMAL HIGH (ref 11.2–14.5)
WBC: 4.4 10*3/uL (ref 3.9–10.3)

## 2013-09-12 LAB — COMPREHENSIVE METABOLIC PANEL (CC13)
ALT: 14 U/L (ref 0–55)
ANION GAP: 9 meq/L (ref 3–11)
AST: 16 U/L (ref 5–34)
Albumin: 3.9 g/dL (ref 3.5–5.0)
Alkaline Phosphatase: 66 U/L (ref 40–150)
BILIRUBIN TOTAL: 0.38 mg/dL (ref 0.20–1.20)
BUN: 13.9 mg/dL (ref 7.0–26.0)
CHLORIDE: 107 meq/L (ref 98–109)
CO2: 28 meq/L (ref 22–29)
Calcium: 9.9 mg/dL (ref 8.4–10.4)
Creatinine: 0.7 mg/dL (ref 0.6–1.1)
Glucose: 73 mg/dl (ref 70–140)
Potassium: 3.9 mEq/L (ref 3.5–5.1)
SODIUM: 144 meq/L (ref 136–145)
Total Protein: 6.6 g/dL (ref 6.4–8.3)

## 2013-09-12 NOTE — Progress Notes (Signed)
[facility]  [phone] Fax:(336) [phone]  OFFICE PROGRESS NOTE    ID: Azlin Zilberman Mcadoo   DOB: 08/08/1966  MR#: [id number]  OQH#:[id number]   PCP: Maisie Fus, MD RAD ONC: Eppie Gibson, M.D.  SU: [name], M.D. SULyndee Leo Sanger, D.O.   DIAGNOSIS: Birgitta Uhlir Laminack is a 48 y.o. 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 Patient was seen in the multidisciplinary clinic on 03/29/2013.   STAGE:  Breast cancer of lower-outer quadrant of left female breast  Primary site: Breast (Left)  Staging method: AJCC 7th Edition  Clinical: Stage IIA (T2, N0, cM0)  Summary: Stage IIA (T2, N0, cM0)   PRIOR THERAPY: #1  For 3-4 months experienced a left nipple bloody discharge. She had a recent mammogram that did not show a mass. She had a physical examination which did show a mass measuring 3.4 cm at the 7 o'clock position. MRI on 03/24/2013 revealed the mass to be 5.0 cm. She went on to have a biopsy performed at the 7 o'clock position that showed invasive ductal carcinoma grade 1. Prognostic markers revealed the tumor to be estrogen receptor positive, progesterone receptor positive, HER-2/neu negative with a low Ki-67 11%.  #2   status post mastectomy as of 04/13/2013. The final pathology did reveal invasive ductal carcinoma with ductal carcinoma in situ. Tumor is ER positive. We sent her tumor for Oncotype testing, but insufficient amount of tumor specimen meant testing could not be performed.  Patient's Severin was discussed again at the multidisciplinary breast conference. It was recommended by the team because there was significant amount of invasive disease that she should get chemotherapy.    #3 status post adjuvant chemotherapy consisting of Taxotere Cytoxan given every 21 days for total of 4 cycles. Chemotherapy was administered from 05/25/2013 through 07/28/2013. Overall patient tolerated treatment well. She did  receive Neulasta on day 2 of each cycle for a total of 4 injections.  #4 patient is undergoing reconstruction of the left breast by Dr. [name].  #5 adjuvant curative intent tamoxifen 20 mg daily begun 08/21/2013. We are planning a total of 10 years of therapy. Risks benef tamoxifen were discussed with the patient in detail today as well as previously.its and side effects of   CURRENT THERAPY:  curative intent tamoxifen 20 mg daily   INTERVAL HISTORY: Holliday Sheaffer Player is a 48 y.o. female who returns for followup visit. She did begin tamoxifen on 08/21/2013. Thus far she is tolerating it well. She does complain of occasional hot flashes. Patient's main concern today is her hair growth of the scalp as well as eyebrows and eyelashes. She did notice that her eyelashes on the left side fell out again. She was very surprised at this. She is also noticed that the scalp hair sometimes breaks that she gets stumbles. We discussed this at great length. She is reassured. She is thinking about getting artificial eyelashes which is fine. She has not noticed any aches or pains. Her overall strength is back she occasionally has fatigue. She is undergoing reconstruction of the left breast. The skin is healing well. She is going to be seeing Dr. Migdalia Dk this afternoon. She still has her port in. Patient would like to have the port out as soon as possible and certainly this can be taken out by either Dr. Migdalia Dk or Dr. Donne Hazel in the near future. Patient otherwise denies any nausea vomiting headaches fevers chills no night sweats no  bleeding. She does have some vaginal discharge but no blood. Remainder of the 10 point review of systems is negative.   MEDICAL HISTORY: Past Medical History  Diagnosis Date  . Thyroid disease   . Breast cancer   . Hashimoto's disease   . Hypothyroidism   . PONV (postoperative nausea and vomiting)   . GERD (gastroesophageal reflux disease)     ALLERGIES:   Allergies  Allergen  Reactions  . Penicillins Swelling  . Sulfa Antibiotics Rash  . Sulfamethoxazole Rash    MEDICATIONS:  Current Outpatient Prescriptions  Medication Sig Dispense Refill  . ALPRAZolam (XANAX) 0.5 MG tablet Take 0.5 mg by mouth as needed for sleep.      . diazepam (VALIUM) 2 MG tablet Take 2 mg by mouth daily as needed (for muscle spasms).       Marland Kitchen levothyroxine (SYNTHROID, LEVOTHROID) 112 MCG tablet Take 112 mcg by mouth daily before breakfast.      . tamoxifen (NOLVADEX) 20 MG tablet Take 1 tablet (20 mg total) by mouth daily.  30 tablet  12  . zolpidem (AMBIEN) 10 MG tablet Take 10 mg by mouth at bedtime as needed for sleep.       No current facility-administered medications for this visit.    SURGICAL HISTORY:  Past Surgical History  Procedure Laterality Date  . Abdominal hysterectomy    . Gum graft    . Simple mastectomy with axillary sentinel node biopsy Left 04/13/2013    [name]  . Mastectomy w/ sentinel node biopsy Left 04/13/2013    Procedure: LEFT SKIN SPARING MASTECTOMY WITH LEFT AXILLARY SENTINEL NODE BIOPSY;  Surgeon: [name], MD;  Location: [facility];  Service: General;  Laterality: Left;  . Breast reconstruction with placement of tissue expander and flex hd (acellular hydrated dermis) Left 04/13/2013    Procedure: LEFT BREAST RECONSTRUCTION WITH PLACEMENT OF TISSUE EXPANDER AND FLEX HD (ACELLULAR HYDRATED DERMIS);  Surgeon: [name], DO;  Location: [facility];  Service: Plastics;  Laterality: Left;  . Portacath placement N/A 05/18/2013    Procedure: INSERTION PORT-A-CATH;  Surgeon: [name], MD;  Location: [facility];  Service: General;  Laterality: N/A;  . Mass excision Left 08/24/2013    Procedure: EXCISION OF WOUND LEFT BREAST WITH CLOSURE ;  Surgeon: [name], DO;  Location: [facility];  Service: Plastics;  Laterality: Left;    REVIEW OF SYSTEMS:  Pertinent items are noted in HPI.   A 10 point review of systems was completed and is  negative except as noted above.  Mrs. Martone denies any other symptomatology including fatigue, fever or chills, headache, vision changes, swollen glands, cough or shortness of breath, chest pain or discomfort, vomiting,  constipation, change in urinary habits, any other arthralgias/myalgias, unusual bleeding/bruising or any other symptomatology.    PHYSICAL EXAMINATION: Blood pressure 137/76, pulse 74, temperature 98.9 F (37.2 C), temperature source Oral, resp. rate 18, height _0  (1.651 m), weight 153 lb 14.4 oz (69.809 kg). Body mass index is 25.61 kg/(m^2). General: Patient is a well appearing female in no acute distress HEENT: PERRLA, sclerae anicteric no conjunctival pallor, MMM Neck: supple, no palpable adenopathy Lungs: clear to auscultation bilaterally, no wheezes, rhonchi, or rales Cardiovascular: regular rate rhythm, S1, S2, no murmurs, rubs or gallops Abdomen: Soft, non-tender, non-distended, normoactive bowel sounds, no HSM Extremities: warm and well perfused, no clubbing, cyanosis, or edema Skin: No rashes or lesions Neuro: Non-focal ECOG PERFORMANCE STATUS: 1 - Symptomatic but completely ambulatory  LABORATORY DATA: Lab Results  Component Value Date   WBC 4.4 09/12/2013   HGB 11.8 09/12/2013   HCT 35.7 09/12/2013   MCV 89.2 09/12/2013   PLT 273 09/12/2013      Chemistry      Component Value Date/Time   NA 144 09/12/2013 0918   NA 137 06/23/2013 1522   K 3.9 09/12/2013 0918   K 4.2 06/23/2013 1522   CL 102 06/23/2013 1522   CO2 28 09/12/2013 0918   CO2 28 06/23/2013 1522   BUN 13.9 09/12/2013 0918   BUN 11 06/23/2013 1522   CREATININE 0.7 09/12/2013 0918   CREATININE 0.70 06/23/2013 1522      Component Value Date/Time   CALCIUM 9.9 09/12/2013 0918   CALCIUM 8.9 06/23/2013 1522   ALKPHOS 66 09/12/2013 0918   ALKPHOS 110 06/23/2013 1522   AST 16 09/12/2013 0918   AST 34 06/23/2013 1522   ALT 14 09/12/2013 0918   ALT 23 06/23/2013 1522   BILITOT 0.38 09/12/2013 0918   BILITOT  0.1* 06/23/2013 1522       RADIOGRAPHIC STUDIES: No results found.    ASSESSMENT: Mahagony Grieb Narain is a 48 y.o. female with:  #1  For 3-4 months experienced a left nipple bloody discharge. She had a recent mammogram that did not show a mass. She had a physical examination which did show a mass measuring 3.4 cm at the 7 o'clock position. MRI on 03/24/2013 revealed the mass to be 5.0 cm. She went on to have a biopsy performed at the 7 o'clock position that showed invasive ductal carcinoma grade 1. Prognostic markers revealed the tumor to be estrogen receptor positive, progesterone receptor positive, HER-2/neu negative with a low Ki-67 11%.  #2 Status post left breast mastectomy on 04/13/2013. The final pathology revealed estrogen receptor positive, invasive ductal carcinoma with The tumor was sent for Oncotype testing, but an insufficient amount of tumor specimen meant testing could not be performed.  Patient's [place] was discussed again at the multidisciplinary breast conference. It was recommended by the team because there was significant amount of invasive disease, Mrs. Roets should receive chemotherapy.  Dr. Humphrey Rolls discussed with the patient and her husband the role of chemotherapy.  #3  Adjuvant chemotherapy consisting of every 3 week Taxotere/Cytoxan x 4 cycles with granulocyte support with Neulasta injection on day 2 of each cycle from 05/25/2013 through 07/27/13.  #4 curative intent tamoxifen 20 mg begun 08/21/2013 tolerating well   PLAN:  #1 continue tamoxifen 20 mg daily.  #2 Will see the patient back in about 4 months time.  #3 in the meantime she can have her port removed by Dr. Donne Hazel.   All questions answered.  Mrs. Jiron was encouraged to contact us in the interim with any questions, concerns, or problems.  I spent 20 minutes counseling the patient face to face.  The total time spent in the appointment was 30 minutes.   Marcy Panning, MD Medical/Oncology [facility] [phone] (beeper) [phone] (Office)  09/12/2013, 1:53 PM

## 2013-09-12 NOTE — Telephone Encounter (Signed)
, °

## 2013-09-22 LAB — FUNGUS CULTURE W SMEAR: Fungal Smear: NONE SEEN

## 2013-11-07 ENCOUNTER — Other Ambulatory Visit: Payer: Self-pay | Admitting: Physician Assistant

## 2013-11-07 ENCOUNTER — Ambulatory Visit (HOSPITAL_COMMUNITY)
Admission: RE | Admit: 2013-11-07 | Discharge: 2013-11-07 | Disposition: A | Discharge status: Home / Self Care | Payer: BC Managed Care – PPO | Source: Ambulatory Visit | Attending: Physician Assistant | Admitting: Physician Assistant

## 2013-11-07 DIAGNOSIS — R079 Chest pain, unspecified: Secondary | ICD-10-CM | POA: Insufficient documentation

## 2013-11-07 DIAGNOSIS — C50512 Malignant neoplasm of lower-outer quadrant of left female breast: Secondary | ICD-10-CM

## 2013-11-07 DIAGNOSIS — Z9010 Acquired absence of unspecified breast and nipple: Secondary | ICD-10-CM | POA: Insufficient documentation

## 2013-11-14 ENCOUNTER — Other Ambulatory Visit: Payer: Self-pay | Admitting: Plastic Surgery

## 2013-11-14 DIAGNOSIS — Z9012 Acquired absence of left breast and nipple: Secondary | ICD-10-CM

## 2013-11-14 NOTE — H&P (Signed)
Amanda Coleman is an 48 y.o. female.   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  She already had the expander and FlexHD placement. She had bloody nipple drainage from her left breast. She palpated a mass but it did no show on the mammagram. The ultrasound and the MRI showed an area of asymmetric enhancement but no real mass. No abnormal nodes or right breast anomalies were noted. 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 She has thyroid disease and has undergone an abdominal hysterectomy and gum graft. She is not a smoker. She is 5 feet 5 inches tall, weighs 137 pounds and wore a 34B bra. She would like to be around the same size. She is doing very well and is currently at 300/275 cc. We tried implants on the right and she is around 100-125cc.   Past Medical History  Diagnosis Date  . Thyroid disease   . Breast cancer   . Hashimoto's disease   . Hypothyroidism   . PONV (postoperative nausea and vomiting)   . GERD (gastroesophageal reflux disease)     Past Surgical History  Procedure Laterality Date  . Abdominal hysterectomy    . Gum graft    . Simple mastectomy with axillary sentinel node biopsy Left 04/13/2013    Dr WAKEFIELD  . Mastectomy w/ sentinel node biopsy Left 04/13/2013    Procedure: LEFT SKIN SPARING MASTECTOMY WITH LEFT AXILLARY SENTINEL NODE BIOPSY;  Surgeon: [name], MD;  Location: [facility];  Service: General;  Laterality: Left;  . Breast reconstruction with placement of tissue expander and flex hd (acellular hydrated dermis) Left 04/13/2013    Procedure: LEFT BREAST RECONSTRUCTION WITH PLACEMENT OF TISSUE EXPANDER AND FLEX HD (ACELLULAR HYDRATED DERMIS);  Surgeon: Katori Wirsing Sanger, DO;  Location: [facility];  Service: Plastics;  Laterality: Left;  . Portacath  placement N/A 05/18/2013    Procedure: INSERTION PORT-A-CATH;  Surgeon: [name], MD;  Location: [facility];  Service: General;  Laterality: N/A;  . Mass excision Left 08/24/2013    Procedure: EXCISION OF WOUND LEFT BREAST WITH CLOSURE ;  Surgeon: Tyresha Fede Sanger, DO;  Location: [facility];  Service: Plastics;  Laterality: Left;    Family History  Problem Relation Age of Onset  . Pancreatic cancer Father 74  . Lung cancer Maternal Aunt     smoker  . Uterine cancer Maternal Grandmother     dx in her 80s  . Ovarian cancer Maternal Grandmother     dx in her 80s   Social History:  reports that she has never smoked. She has never used smokeless tobacco. She reports that she does not drink alcohol or use illicit drugs.  Allergies:  Allergies  Allergen Reactions  . Penicillins Swelling  . Sulfa Antibiotics Rash  . Sulfamethoxazole Rash     (Not in a hospital admission)  No results found for this or any previous visit (from the past 48 hour(s)). No results found.  ROS  There were no vitals taken for this visit. Physical Exam   Assessment/Plan Plan for remove left breast tissue expander & placement of left breast implant; placement of right breast implant for symmetry.  SANGER,Zenab [place]one[place]old 11/14/2013, 11:01 AM    

## 2013-11-16 ENCOUNTER — Encounter (HOSPITAL_BASED_OUTPATIENT_CLINIC_OR_DEPARTMENT_OTHER): Payer: Self-pay | Admitting: *Deleted

## 2013-11-16 NOTE — Progress Notes (Signed)
No labs needed

## 2013-11-20 ENCOUNTER — Telehealth: Payer: Self-pay | Admitting: *Deleted

## 2013-11-20 NOTE — Telephone Encounter (Signed)
Pt called asking if Dr. Humphrey Rolls has approved the removal of her port.  Informed pt that Dr. Humphrey Rolls has granted removal.  I have informed Dr. Migdalia Dk and Dr. Donne Hazel.

## 2013-11-23 ENCOUNTER — Encounter (HOSPITAL_BASED_OUTPATIENT_CLINIC_OR_DEPARTMENT_OTHER)
Admission: RE | Disposition: A | Discharge status: Home / Self Care | Payer: Self-pay | Source: Ambulatory Visit | Attending: Plastic Surgery

## 2013-11-23 ENCOUNTER — Encounter (HOSPITAL_BASED_OUTPATIENT_CLINIC_OR_DEPARTMENT_OTHER): Payer: Self-pay | Admitting: Plastic Surgery

## 2013-11-23 ENCOUNTER — Encounter (HOSPITAL_BASED_OUTPATIENT_CLINIC_OR_DEPARTMENT_OTHER): Payer: BC Managed Care – PPO | Admitting: Anesthesiology

## 2013-11-23 ENCOUNTER — Ambulatory Visit (HOSPITAL_BASED_OUTPATIENT_CLINIC_OR_DEPARTMENT_OTHER)
Admission: RE | Admit: 2013-11-23 | Discharge: 2013-11-23 | Disposition: A | Discharge status: Home / Self Care | Payer: BC Managed Care – PPO | Source: Ambulatory Visit | Attending: Plastic Surgery | Admitting: Plastic Surgery

## 2013-11-23 ENCOUNTER — Ambulatory Visit (HOSPITAL_BASED_OUTPATIENT_CLINIC_OR_DEPARTMENT_OTHER): Payer: BC Managed Care – PPO | Admitting: Anesthesiology

## 2013-11-23 DIAGNOSIS — E039 Hypothyroidism, unspecified: Secondary | ICD-10-CM | POA: Insufficient documentation

## 2013-11-23 DIAGNOSIS — Z9012 Acquired absence of left breast and nipple: Secondary | ICD-10-CM

## 2013-11-23 DIAGNOSIS — Z9010 Acquired absence of unspecified breast and nipple: Secondary | ICD-10-CM | POA: Insufficient documentation

## 2013-11-23 DIAGNOSIS — N651 Disproportion of reconstructed breast: Secondary | ICD-10-CM | POA: Insufficient documentation

## 2013-11-23 DIAGNOSIS — Z452 Encounter for adjustment and management of vascular access device: Secondary | ICD-10-CM | POA: Insufficient documentation

## 2013-11-23 DIAGNOSIS — Z853 Personal history of malignant neoplasm of breast: Secondary | ICD-10-CM | POA: Insufficient documentation

## 2013-11-23 DIAGNOSIS — E063 Autoimmune thyroiditis: Secondary | ICD-10-CM | POA: Diagnosis not present

## 2013-11-23 DIAGNOSIS — K219 Gastro-esophageal reflux disease without esophagitis: Secondary | ICD-10-CM | POA: Diagnosis not present

## 2013-11-23 HISTORY — PX: REMOVAL OF TISSUE EXPANDER AND PLACEMENT OF IMPLANT: SHX6457

## 2013-11-23 LAB — POCT HEMOGLOBIN-HEMACUE: HEMOGLOBIN: 12.5 g/dL (ref 12.0–15.0)

## 2013-11-23 SURGERY — REMOVAL, TISSUE EXPANDER, BREAST, WITH IMPLANT INSERTION
Anesthesia: General | Site: Breast | Laterality: Left

## 2013-11-23 MED ORDER — CIPROFLOXACIN IN D5W 400 MG/200ML IV SOLN
400.0000 mg | INTRAVENOUS | Status: AC
  Administered 2013-11-23: 400 mg via INTRAVENOUS

## 2013-11-23 MED ORDER — PROPOFOL 10 MG/ML IV BOLUS
INTRAVENOUS | Status: DC | PRN
  Administered 2013-11-23: 200 mg via INTRAVENOUS

## 2013-11-23 MED ORDER — LIDOCAINE HCL (PF) 1 % IJ SOLN
INTRAMUSCULAR | Status: AC
  Filled 2013-11-23: qty 60

## 2013-11-23 MED ORDER — SCOPOLAMINE 1 MG/3DAYS TD PT72
MEDICATED_PATCH | TRANSDERMAL | Status: AC
  Filled 2013-11-23: qty 1

## 2013-11-23 MED ORDER — SODIUM CHLORIDE 0.9 % IR SOLN
Status: DC | PRN
  Administered 2013-11-23: 15:00:00

## 2013-11-23 MED ORDER — SCOPOLAMINE 1 MG/3DAYS TD PT72
1.0000 | MEDICATED_PATCH | TRANSDERMAL | Status: DC
  Administered 2013-11-23: 1.5 mg via TRANSDERMAL

## 2013-11-23 MED ORDER — SUCCINYLCHOLINE CHLORIDE 20 MG/ML IJ SOLN
INTRAMUSCULAR | Status: DC | PRN
  Administered 2013-11-23: 100 mg via INTRAVENOUS

## 2013-11-23 MED ORDER — FENTANYL CITRATE 0.05 MG/ML IJ SOLN: 50.0000 ug | INTRAMUSCULAR | Status: DC | PRN

## 2013-11-23 MED ORDER — EPINEPHRINE HCL 1 MG/ML IJ SOLN
INTRAMUSCULAR | Status: AC
Start: 2013-11-23 — End: 2013-11-23
  Filled 2013-11-23: qty 1

## 2013-11-23 MED ORDER — ONDANSETRON HCL 4 MG/2ML IJ SOLN: 4.0000 mg | Freq: Once | INTRAMUSCULAR | Status: DC | PRN

## 2013-11-23 MED ORDER — FENTANYL CITRATE 0.05 MG/ML IJ SOLN
INTRAMUSCULAR | Status: AC
  Filled 2013-11-23: qty 4

## 2013-11-23 MED ORDER — ONDANSETRON HCL 4 MG/2ML IJ SOLN
INTRAMUSCULAR | Status: DC | PRN
  Administered 2013-11-23: 4 mg via INTRAVENOUS

## 2013-11-23 MED ORDER — FENTANYL CITRATE 0.05 MG/ML IJ SOLN
INTRAMUSCULAR | Status: AC
  Filled 2013-11-23: qty 6

## 2013-11-23 MED ORDER — HYDROMORPHONE HCL PF 1 MG/ML IJ SOLN
0.2500 mg | INTRAMUSCULAR | Status: DC | PRN
  Administered 2013-11-23 (×2): 0.5 mg via INTRAVENOUS

## 2013-11-23 MED ORDER — DEXAMETHASONE SODIUM PHOSPHATE 4 MG/ML IJ SOLN
INTRAMUSCULAR | Status: DC | PRN
  Administered 2013-11-23: 10 mg via INTRAVENOUS

## 2013-11-23 MED ORDER — HYDROMORPHONE HCL PF 1 MG/ML IJ SOLN
INTRAMUSCULAR | Status: AC
  Filled 2013-11-23: qty 1

## 2013-11-23 MED ORDER — LIDOCAINE HCL (CARDIAC) 20 MG/ML IV SOLN
INTRAVENOUS | Status: DC | PRN
  Administered 2013-11-23: 60 mg via INTRAVENOUS

## 2013-11-23 MED ORDER — OXYCODONE HCL 5 MG/5ML PO SOLN: 5.0000 mg | Freq: Once | ORAL | Status: AC | PRN

## 2013-11-23 MED ORDER — POLYMYXIN B SULFATE 500000 UNITS IJ SOLR
INTRAMUSCULAR | Status: DC | PRN
  Administered 2013-11-23: 15:00:00

## 2013-11-23 MED ORDER — OXYCODONE HCL 5 MG PO TABS
5.0000 mg | ORAL_TABLET | Freq: Once | ORAL | Status: AC | PRN
  Administered 2013-11-23: 5 mg via ORAL

## 2013-11-23 MED ORDER — OXYCODONE HCL 5 MG PO TABS
ORAL_TABLET | ORAL | Status: AC
  Filled 2013-11-23: qty 1

## 2013-11-23 MED ORDER — MIDAZOLAM HCL 2 MG/2ML IJ SOLN
INTRAMUSCULAR | Status: AC
  Filled 2013-11-23: qty 2

## 2013-11-23 MED ORDER — MIDAZOLAM HCL 5 MG/5ML IJ SOLN
INTRAMUSCULAR | Status: DC | PRN
  Administered 2013-11-23: 2 mg via INTRAVENOUS

## 2013-11-23 MED ORDER — MIDAZOLAM HCL 2 MG/2ML IJ SOLN: 1.0000 mg | INTRAMUSCULAR | Status: DC | PRN

## 2013-11-23 MED ORDER — CIPROFLOXACIN IN D5W 400 MG/200ML IV SOLN
INTRAVENOUS | Status: AC
  Filled 2013-11-23: qty 200

## 2013-11-23 MED ORDER — LACTATED RINGERS IV SOLN
INTRAVENOUS | Status: DC
  Administered 2013-11-23 (×2): via INTRAVENOUS
  Administered 2013-11-23: 20 mL/h via INTRAVENOUS

## 2013-11-23 MED ORDER — LIDOCAINE-EPINEPHRINE 1 %-1:100000 IJ SOLN
INTRAMUSCULAR | Status: AC
  Filled 2013-11-23: qty 1

## 2013-11-23 MED ORDER — FENTANYL CITRATE 0.05 MG/ML IJ SOLN
INTRAMUSCULAR | Status: DC | PRN
  Administered 2013-11-23: 50 ug via INTRAVENOUS
  Administered 2013-11-23: 100 ug via INTRAVENOUS
  Administered 2013-11-23: 50 ug via INTRAVENOUS

## 2013-11-23 MED ORDER — BUPIVACAINE-EPINEPHRINE PF 0.25-1:200000 % IJ SOLN
INTRAMUSCULAR | Status: AC
  Filled 2013-11-23: qty 30

## 2013-11-23 SURGICAL SUPPLY — 73 items
ADH SKN CLS APL DERMABOND .7 (GAUZE/BANDAGES/DRESSINGS) ×2
BAG DECANTER FOR FLEXI CONT (MISCELLANEOUS) ×8 IMPLANT
BINDER BREAST LRG (GAUZE/BANDAGES/DRESSINGS) IMPLANT
BINDER BREAST MEDIUM (GAUZE/BANDAGES/DRESSINGS) ×4 IMPLANT
BINDER BREAST XLRG (GAUZE/BANDAGES/DRESSINGS) IMPLANT
BINDER BREAST XXLRG (GAUZE/BANDAGES/DRESSINGS) IMPLANT
BIOPATCH RED 1 DISK 7.0 (GAUZE/BANDAGES/DRESSINGS) ×3 IMPLANT
BIOPATCH RED 1IN DISK 7.0MM (GAUZE/BANDAGES/DRESSINGS) ×1
BLADE HEX COATED 2.75 (ELECTRODE) ×4 IMPLANT
BLADE SURG 15 STRL LF DISP TIS (BLADE) ×2 IMPLANT
BLADE SURG 15 STRL SS (BLADE) ×2
BNDG GAUZE ELAST 4 BULKY (GAUZE/BANDAGES/DRESSINGS) ×8 IMPLANT
CANISTER SUCT 1200ML W/VALVE (MISCELLANEOUS) ×4 IMPLANT
CHLORAPREP W/TINT 26ML (MISCELLANEOUS) ×4 IMPLANT
CORDS BIPOLAR (ELECTRODE) IMPLANT
COVER MAYO STAND STRL (DRAPES) ×4 IMPLANT
COVER TABLE BACK 60X90 (DRAPES) ×4 IMPLANT
DECANTER SPIKE VIAL GLASS SM (MISCELLANEOUS) ×4 IMPLANT
DERMABOND ADVANCED (GAUZE/BANDAGES/DRESSINGS) ×4
DERMABOND ADVANCED .7 DNX12 (GAUZE/BANDAGES/DRESSINGS) ×4 IMPLANT
DRAIN CHANNEL 19F RND (DRAIN) ×4 IMPLANT
DRAPE LAPAROSCOPIC ABDOMINAL (DRAPES) ×4 IMPLANT
DRSG PAD ABDOMINAL 8X10 ST (GAUZE/BANDAGES/DRESSINGS) ×8 IMPLANT
ELECT BLADE 4.0 EZ CLEAN MEGAD (MISCELLANEOUS) ×8
ELECT REM PT RETURN 9FT ADLT (ELECTROSURGICAL) ×4
ELECTRODE BLDE 4.0 EZ CLN MEGD (MISCELLANEOUS) ×4 IMPLANT
ELECTRODE REM PT RTRN 9FT ADLT (ELECTROSURGICAL) ×2 IMPLANT
EVACUATOR SILICONE 100CC (DRAIN) ×4 IMPLANT
FILTER LIPOSUCTION (MISCELLANEOUS) ×4 IMPLANT
GLOVE BIO SURGEON STRL SZ 6.5 (GLOVE) ×12 IMPLANT
GLOVE BIO SURGEONS STRL SZ 6.5 (GLOVE) ×4
GOWN STRL REUS W/ TWL LRG LVL3 (GOWN DISPOSABLE) ×6 IMPLANT
GOWN STRL REUS W/TWL LRG LVL3 (GOWN DISPOSABLE) ×6
IMPL GEL HP 200CC (Breast) ×2 IMPLANT
IMPLANT GEL HP 200CC (Breast) ×4 IMPLANT
IV NS 1000ML (IV SOLUTION)
IV NS 1000ML BAXH (IV SOLUTION) IMPLANT
KIT FILL SYSTEM UNIVERSAL (SET/KITS/TRAYS/PACK) IMPLANT
NDL SAFETY ECLIPSE 18X1.5 (NEEDLE) ×2 IMPLANT
NEEDLE HYPO 18GX1.5 SHARP (NEEDLE) ×2
NEEDLE HYPO 25X1 1.5 SAFETY (NEEDLE) ×4 IMPLANT
NEEDLE SPNL 18GX3.5 QUINCKE PK (NEEDLE) ×4 IMPLANT
PACK BASIN DAY SURGERY FS (CUSTOM PROCEDURE TRAY) ×4 IMPLANT
PAD ALCOHOL SWAB (MISCELLANEOUS) ×4 IMPLANT
PENCIL BUTTON HOLSTER BLD 10FT (ELECTRODE) ×8 IMPLANT
PIN SAFETY STERILE (MISCELLANEOUS) IMPLANT
SIZER BREAST GEL MP REUS 200CC (SIZER) ×4 IMPLANT
SIZER BREAST GENERIC MENTOR (SIZER) ×8 IMPLANT
SIZER BREAST REUSE 250CC (SIZER) ×2
SIZER BREAST REUSE 260CC (SIZER) ×4 IMPLANT
SIZER BREAST REUSE 300CC (SIZER) ×4 IMPLANT
SIZER BRST REUSE P4.3XHI 250CC (SIZER) ×2 IMPLANT
SIZER GENERIC MENTOR (SIZER) ×8 IMPLANT
SLEEVE SCD COMPRESS KNEE MED (MISCELLANEOUS) ×4 IMPLANT
SPONGE GAUZE 4X4 12PLY STER LF (GAUZE/BANDAGES/DRESSINGS) IMPLANT
SPONGE LAP 18X18 X RAY DECT (DISPOSABLE) ×8 IMPLANT
SUT MNCRL AB 4-0 PS2 18 (SUTURE) ×4 IMPLANT
SUT MON AB 5-0 PS2 18 (SUTURE) ×12 IMPLANT
SUT PDS AB 2-0 CT2 27 (SUTURE) ×16 IMPLANT
SUT VIC AB 3-0 SH 27 (SUTURE) ×9
SUT VIC AB 3-0 SH 27X BRD (SUTURE) ×6 IMPLANT
SUT VICRYL 4-0 PS2 18IN ABS (SUTURE) IMPLANT
SYR 20CC LL (SYRINGE) IMPLANT
SYR 50ML LL SCALE MARK (SYRINGE) ×4 IMPLANT
SYR BULB IRRIGATION 50ML (SYRINGE) ×4 IMPLANT
SYR CONTROL 10ML LL (SYRINGE) ×4 IMPLANT
SYR TB 1ML LL NO SAFETY (SYRINGE) IMPLANT
TOWEL OR 17X24 6PK STRL BLUE (TOWEL DISPOSABLE) ×8 IMPLANT
TUBE CONNECTING 20'X1/4 (TUBING) ×1
TUBE CONNECTING 20X1/4 (TUBING) ×3 IMPLANT
TUBING SET GRADUATE ASPIR 12FT (MISCELLANEOUS) IMPLANT
UNDERPAD 30X30 INCONTINENT (UNDERPADS AND DIAPERS) ×8 IMPLANT
YANKAUER SUCT BULB TIP NO VENT (SUCTIONS) ×4 IMPLANT

## 2013-11-23 NOTE — Anesthesia Postprocedure Evaluation (Signed)
  Anesthesia Post-op Note  Patient: Amanda Coleman  Procedure(s) Performed: Procedure(s): REMOVAL OF LEFT  TISSUE EXPANDER WITH  PLACEMENT OF IMPLANT TO LEFT BREAST,AND REMOVAL OF PORT ON RIGHT (Left)  Patient Location: PACU  Anesthesia Type:General  Level of Consciousness: awake, alert  and oriented  Airway and Oxygen Therapy: Patient Spontanous Breathing  Post-op Pain: mild  Post-op Assessment: Post-op Vital signs reviewed  Post-op Vital Signs: Reviewed  Last Vitals:  Filed Vitals:   11/23/13 1715  BP: 136/75  Pulse: 72  Temp:   Resp: 16    Complications: No apparent anesthesia complications

## 2013-11-23 NOTE — Transfer of Care (Signed)
Immediate Anesthesia Transfer of Care Note  Patient: Amanda Coleman  Procedure(s) Performed: Procedure(s): REMOVAL OF LEFT  TISSUE EXPANDER WITH  PLACEMENT OF IMPLANT TO LEFT BREAST,AND REMOVAL OF PORT ON RIGHT (Left)  Patient Location: PACU  Anesthesia Type:General  Level of Consciousness: sedated  Airway & Oxygen Therapy: Patient Spontanous Breathing and Patient connected to face mask oxygen  Post-op Assessment: Report given to PACU RN and Post -op Vital signs reviewed and stable  Post vital signs: Reviewed and stable  Complications: No apparent anesthesia complications

## 2013-11-23 NOTE — Discharge Instructions (Signed)
Drain Care Continue binder or sports bra               .     _______________________________  _____________________________________________________________________  _____________________________________________________________________  _____________________________________________________________________  _____________________________________________________________________  _____________________________________________________________________   .    :   .   About my Jackson-Pratt Bulb Drain  What is a Jackson-Pratt bulb? A Jackson-Pratt is a soft, round device used to collect drainage. It is connected to a long, thin drainage catheter, which is held in place by one or two small stiches near your surgical incision site. When the bulb is squeezed, it forms a vacuum, forcing the drainage to empty into the bulb.  Emptying the Jackson-Pratt bulb- To empty the bulb: 1. Release the plug on the top of the bulb. 2. Pour the bulb's contents into a measuring container which your nurse will provide. 3. Record the time emptied and amount of drainage. Empty the drain(s) as often as your     doctor or nurse recommends.  Date                  Time                    Amount (Drain 1)                 Amount (Drain 2)  _____________________________________________________________________  _____________________________________________________________________  _____________________________________________________________________  _____________________________________________________________________  _____________________________________________________________________  _____________________________________________________________________  _____________________________________________________________________  _____________________________________________________________________  Squeezing the Jackson-Pratt Bulb- To squeeze the bulb: 1. Make sure the plug at the  top of the bulb is open. 2. Squeeze the bulb tightly in your fist. You will hear air squeezing from the bulb. 3. Replace the plug while the bulb is squeezed. 4. Use a safety pin to attach the bulb to your clothing. This will keep the catheter from     pulling at the bulb insertion site.  When to call your doctor- Call your doctor if:  Drain site becomes red, swollen or hot.  You have a fever greater than 101 degrees F.  There is oozing at the drain site.  Drain falls out (apply a guaze bandage over the drain hole and secure it with tape).  Drainage increases daily not related to activity patterns. (You will usually have more drainage when you are active than when you are resting.)  Drainage has a bad odor.   Post Anesthesia Home Care Instructions  Activity: Get plenty of rest for the remainder of the day. A responsible adult should stay with you for 24 hours following the procedure.  For the next 24 hours, DO NOT: -Drive a car -Paediatric nurse -Drink alcoholic beverages -Take any medication unless instructed by your physician -Make any legal decisions or sign important papers.  Meals: Start with liquid foods such as gelatin or soup. Progress to regular foods as tolerated. Avoid greasy, spicy, heavy foods. If nausea and/or vomiting occur, drink only clear liquids until the nausea and/or vomiting subsides. Call your physician if vomiting continues.  Special Instructions/Symptoms: Your throat may feel dry or sore from the anesthesia or the breathing tube placed in your throat during surgery. If this causes discomfort, gargle with warm salt water. The discomfort should disappear within 24 hours.

## 2013-11-23 NOTE — Interval H&P Note (Signed)
History and Physical Interval Note:  11/23/2013 8:42 AM  Amanda Coleman  has presented today for surgery, with the diagnosis of ACQURED ABSENT OF BREAST AND NIPPLE AND HISTORY OF BREAST CANCER  The various methods of treatment have been discussed with the patient and family. After consideration of risks, benefits and other options for treatment, the patient has consented to  Procedure(s): REMOVAL OF LEFT  TISSUE EXPANDER WITH  PLACEMENT OF IMPLANT TO LEFT BREAST AND PLACEMENT OF IMPLANT TO RIGHT BREAST FOR ASYMMETRY (Bilateral) POSSIBLE LIPOSUCTION WITH LIPOFILLING (Bilateral) as a surgical intervention .  The patient's history has been reviewed, patient examined, no change in status, stable for surgery.  I have reviewed the patient's chart and labs.  Questions were answered to the patient's satisfaction.     Lyndee Leo [employer]

## 2013-11-23 NOTE — Anesthesia Procedure Notes (Signed)
Procedure Name: Intubation Date/Time: 11/23/2013 2:14 PM Performed by: Nariya Neumeyer Pre-anesthesia Checklist: Patient identified, Emergency Drugs available, Suction available and Patient being monitored Patient Re-evaluated:Patient Re-evaluated prior to inductionOxygen Delivery Method: Circle System Utilized Preoxygenation: Pre-oxygenation with 100% oxygen Intubation Type: IV induction Ventilation: Mask ventilation without difficulty Laryngoscope Size: Mac and 3 Grade View: Grade I Tube type: Oral Tube size: 7.0 mm Number of attempts: 1 Airway Equipment and Method: stylet and LTA kit utilized Placement Confirmation: ETT inserted through vocal cords under direct vision,  positive ETCO2 and breath sounds checked- equal and bilateral Secured at: 21 cm Tube secured with: Tape Dental Injury: Teeth and Oropharynx as per pre-operative assessment  Comments:  Slightly anterior

## 2013-11-23 NOTE — Anesthesia Preprocedure Evaluation (Signed)
Anesthesia Evaluation  Patient identified by MRN, date of birth, ID band Patient awake    Reviewed: Allergy & Precautions, H&P , NPO status , Patient's Chart, lab work & pertinent test results  History of Anesthesia Complications (+) PONV  Airway Mallampati: I TM Distance: >3 FB Neck ROM: Full    Dental  (+) Teeth Intact, Dental Advisory Given   Pulmonary  breath sounds clear to auscultation        Cardiovascular Rhythm:Regular Rate:Normal     Neuro/Psych    GI/Hepatic GERD-  Medicated and Controlled,  Endo/Other    Renal/GU      Musculoskeletal   Abdominal   Peds  Hematology   Anesthesia Other Findings   Reproductive/Obstetrics                           Anesthesia Physical Anesthesia Plan  ASA: II  Anesthesia Plan: General   Post-op Pain Management:    Induction: Intravenous  Airway Management Planned: Oral ETT  Additional Equipment:   Intra-op Plan:   Post-operative Plan: Extubation in OR  Informed Consent: I have reviewed the patients History and Physical, chart, labs and discussed the procedure including the risks, benefits and alternatives for the proposed anesthesia with the patient or authorized representative who has indicated his/her understanding and acceptance.   Dental advisory given  Plan Discussed with: CRNA, Anesthesiologist and Surgeon  Anesthesia Plan Comments:         Anesthesia Quick Evaluation  

## 2013-11-23 NOTE — Op Note (Addendum)
Op report Breast Exchange   DATE OF OPERATION:  11/23/2013  LOCATION: [facility]  SURGICAL DIVISION: Plastic Surgery  PREOPERATIVE DIAGNOSES:  1. History of breast cancer.  2. Acquired absence of left breast.  3. Breast asymmetry of the right after surgery for breast cancer. 4. Port-a-cath in place.  POSTOPERATIVE DIAGNOSES:  1. History of breast cancer.  2. Acquired absence of left breast.  3. Breast asymmetry of the right after surgery for breast cancer. 4. Port-a-cath in place  PROCEDURE:  1. Exchange of tissue expander for left breast implant.  2. Left  breast capsulotomies for implant respositioning. 3. Removal of right port-a-catheter.  SURGEON: [name], DO  ASSISTANT: Shawn Rayburn, PA  ANESTHESIA:  General.   COMPLICATIONS: None.   IMPLANTS: Left - [place]or Smooth Round High Profile Gel 200cc. Ref #[id number].  Serial Number [id number]  INDICATIONS FOR PROCEDURE:  The patient, Amanda Coleman, is a 48 y.o. female born on Nov 12, 1965, is here for treatment for further treatment after a mastectomy and placement of a tissue expander. She now presents for exchange of her expander for an implant.  She requires capsulotomies to better position the implant. She is also wanting an implant placed on the right for symmetry.   CONSENT:  Informed consent was obtained directly from the patient. Risks, benefits and alternatives were fully discussed. Specific risks including but not limited to bleeding, infection, hematoma, seroma, scarring, pain, implant infection, implant extrusion, capsular contracture, asymmetry, wound healing problems, and need for further surgery were all discussed. The patient did have an ample opportunity to have her questions answered to her satisfaction.   DESCRIPTION OF PROCEDURE:  The patient was taken to the operating room. SCDs were placed and IV antibiotics were given. The patient's chest was prepped and draped in a sterile fashion.  A time out was performed and the implants to be used were identified.  One percent Xylocaine with epinephrine was used to infiltrate the area.   The left old mastectomy scar was opened and superior mastectomy and inferior mastectomy flaps were re-raised over the pectoralis major muscle. The pectoralis was split to expose the tissue expander which was removed. Inspection of the pocket showed a normal healthy capsule and good integration of the biologic matrix.   Circumferential capsulotomies were performed on each breast to allow for breast pocket expansion on either side.  Measurements were made on either side to confirm adequate pocket size for the implant dimensions.  Hemostasis was ensured.  Gloves were changed. The implants were placed in the pockets and oriented appropriately. The pectoralis major muscle and capsule on the anterior surface were re-closed with a 3-0 running Vicryl suture. The remaining skin was closed with 4-0 Monocryl deep dermal and 5-0 Monocryl subcuticular stitches.    Attention was turned to the right breast.  An incision was made at the inframammary fold using a #10 blade.  The bovie was used to dissect down to the pectoralis muscle.  The muscle was lifted from the chest wall to create a pocket for the implant.  The area was irrigated with antibiotic solution and hemostasis was achieved with electrocautery.  The sizer was placed and the patient was sat up to examine for symmetry.  The 200 cc sizer was the smallest that we tried and was selected after the others were too large. The implant was selected and placed in the pocket.  The pocket was moist and therefore a drain was placed and secured to the chest with a  3-0 Nylon suture. Due to the tightness on the left the decision was made no to place an implant on the right at this time. The deep layers were closed with 3-0 Vicryl followed by 4-0 Moncryl and the skin closed with 5-0 Monocryl.    The port-a-cath was removed by the  following method.  The #15 blade was used to make an incision in the skin and the bovie was used to dissect down to the catheter.  A stitch of 4-0 Monocryl was used to place around the entry part of the catheter and tighten it up as the catheter was removed.  The port access was then removed by releasing the sutures.  The deep layers were closed with 4-0 Monocryl followed by a 5-0 Monocryl.  Dermabond was applied to all incision sites.  A breast binder and ABD was applied. The patient was allowed to wake from anesthesia and taken to the recovery room in satisfactory condition.

## 2013-11-23 NOTE — H&P (View-Only) (Signed)
Amanda Coleman is an 48 y.o. female.   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  She already had the expander and FlexHD placement. She had bloody nipple drainage from her left breast. She palpated a mass but it did no show on the mammagram. The ultrasound and the MRI showed an area of asymmetric enhancement but no real mass. No abnormal nodes or right breast anomalies were noted. 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 She has thyroid disease and has undergone an abdominal hysterectomy and gum graft. She is not a smoker. She is 5 feet 5 inches tall, weighs 137 pounds and wore a 34B bra. She would like to be around the same size. She is doing very well and is currently at 300/275 cc. We tried implants on the right and she is around 100-125cc.   Past Medical History  Diagnosis Date  . Thyroid disease   . Breast cancer   . Hashimoto's disease   . Hypothyroidism   . PONV (postoperative nausea and vomiting)   . GERD (gastroesophageal reflux disease)     Past Surgical History  Procedure Laterality Date  . Abdominal hysterectomy    . Gum graft    . Simple mastectomy with axillary sentinel node biopsy Left 04/13/2013    [name]  . Mastectomy w/ sentinel node biopsy Left 04/13/2013    Procedure: LEFT SKIN SPARING MASTECTOMY WITH LEFT AXILLARY SENTINEL NODE BIOPSY;  Surgeon: [name], MD;  Location: [facility];  Service: General;  Laterality: Left;  . Breast reconstruction with placement of tissue expander and flex hd (acellular hydrated dermis) Left 04/13/2013    Procedure: LEFT BREAST RECONSTRUCTION WITH PLACEMENT OF TISSUE EXPANDER AND FLEX HD (ACELLULAR HYDRATED DERMIS);  Surgeon: [name], DO;  Location: [facility];  Service: Plastics;  Laterality: Left;  . Portacath  placement N/A 05/18/2013    Procedure: INSERTION PORT-A-CATH;  Surgeon: [name], MD;  Location: [facility];  Service: General;  Laterality: N/A;  . Mass excision Left 08/24/2013    Procedure: EXCISION OF WOUND LEFT BREAST WITH CLOSURE ;  Surgeon: [name], DO;  Location: [facility];  Service: Plastics;  Laterality: Left;    Family History  Problem Relation Age of Onset  . Pancreatic cancer Father 45  . Lung cancer Maternal Aunt     smoker  . Uterine cancer Maternal Grandmother     dx in her 86s  . Ovarian cancer Maternal Grandmother     dx in her 3s   Social History:  reports that she has never smoked. She has never used smokeless tobacco. She reports that she does not drink alcohol or use illicit drugs.  Allergies:  Allergies  Allergen Reactions  . Penicillins Swelling  . Sulfa Antibiotics Rash  . Sulfamethoxazole Rash     (Not in a hospital admission)  No results found for this or any previous visit (from the past 48 hour(s)). No results found.  ROS  There were no vitals taken for this visit. Physical Exam   Assessment/Plan Plan for remove left breast tissue expander & placement of left breast implant; placement of right breast implant for symmetry.  [place] 11/14/2013, 11:01 AM

## 2013-11-24 ENCOUNTER — Encounter (HOSPITAL_BASED_OUTPATIENT_CLINIC_OR_DEPARTMENT_OTHER): Payer: Self-pay | Admitting: Plastic Surgery

## 2013-12-14 ENCOUNTER — Ambulatory Visit (INDEPENDENT_AMBULATORY_CARE_PROVIDER_SITE_OTHER): Payer: BC Managed Care – PPO | Admitting: General Surgery

## 2013-12-14 ENCOUNTER — Encounter (INDEPENDENT_AMBULATORY_CARE_PROVIDER_SITE_OTHER): Payer: Self-pay | Admitting: General Surgery

## 2013-12-14 VITALS — BP 138/86 | HR 74 | Temp 97.6°F | Resp 12 | Ht 65.0 in | Wt 151.4 lb

## 2013-12-14 DIAGNOSIS — C50519 Malignant neoplasm of lower-outer quadrant of unspecified female breast: Secondary | ICD-10-CM

## 2013-12-14 DIAGNOSIS — C50512 Malignant neoplasm of lower-outer quadrant of left female breast: Secondary | ICD-10-CM

## 2013-12-14 MED ORDER — VENLAFAXINE HCL ER 37.5 MG PO CP24: 37.5000 mg | ORAL_CAPSULE | Freq: Every day | ORAL | Status: DC

## 2013-12-14 NOTE — Progress Notes (Signed)
Subjective:     Patient ID: Amanda Coleman, female   DOB: [birth year], 48 y.o.   MRN: [id number]  HPI This is a 48 year old female who recently underwent a left simple mastectomy and sentinel node biopsy for a stage II breast cancer. She has undergone chemotherapy and is now on tamoxifen.  She is having pretty significant night sweats and is not sleeping well.  She is having some difficulty with her situation now.  3 weeks ago she underwent exchange for implant and this was smaller implant.   She said there is some concern about healing on the left side and she is very emotional about this today  Review of Systems     Objective:   Physical Exam  Vitals reviewed. Constitutional: She appears well-developed and well-nourished.  Pulmonary/Chest: Right breast exhibits no inverted nipple, no mass, no nipple discharge, no skin change and no tenderness.    Lymphadenopathy:    She has no cervical adenopathy.    She has no axillary adenopathy.       Right: No supraclavicular adenopathy present.       Left: No supraclavicular adenopathy present.       Assessment:     Stage II left breast cancer     Plan:     1. She is doing well without any evidence of recurrence. Will continue left sided yearly mm, monthly sbe and q 6 months clinical exams 2. Will monitor her implant. We discussed for some time possbilities moving forward and that all of this is her choice. Hopefully this will heal and she is not overly concerned about this disparity. 3. I will start her on effexor due to her hot flashes and she will follow up by phone in 3 weeks. 4. Referral to see PT/lymphedema specialist Leone Payor once Dr Migdalia Dk thinks this is ok 5. I will follow her up in June   6. I also recommended her to follow up with Bary Castilla at the cancer center to discuss [facility]

## 2013-12-18 ENCOUNTER — Telehealth: Payer: Self-pay | Admitting: Hematology and Oncology

## 2013-12-18 NOTE — Telephone Encounter (Signed)
, °

## 2013-12-19 NOTE — Addendum Note (Signed)
Addended by: Illene Regulus on: 12/19/2013 09:45 AM   Modules accepted: Orders

## 2013-12-25 ENCOUNTER — Ambulatory Visit: Payer: BC Managed Care – PPO | Attending: General Surgery | Admitting: Physical Therapy

## 2013-12-25 DIAGNOSIS — M24519 Contracture, unspecified shoulder: Secondary | ICD-10-CM | POA: Insufficient documentation

## 2013-12-25 DIAGNOSIS — IMO0001 Reserved for inherently not codable concepts without codable children: Secondary | ICD-10-CM | POA: Insufficient documentation

## 2013-12-26 ENCOUNTER — Ambulatory Visit: Payer: Self-pay | Admitting: Oncology

## 2013-12-26 ENCOUNTER — Other Ambulatory Visit: Payer: Self-pay

## 2013-12-28 ENCOUNTER — Ambulatory Visit: Payer: BC Managed Care – PPO | Admitting: Physical Therapy

## 2014-01-01 ENCOUNTER — Ambulatory Visit: Payer: BC Managed Care – PPO | Admitting: Physical Therapy

## 2014-01-04 ENCOUNTER — Ambulatory Visit: Payer: BC Managed Care – PPO | Admitting: Physical Therapy

## 2014-01-09 ENCOUNTER — Ambulatory Visit: Payer: BC Managed Care – PPO | Admitting: Physical Therapy

## 2014-01-10 ENCOUNTER — Telehealth (INDEPENDENT_AMBULATORY_CARE_PROVIDER_SITE_OTHER): Payer: Self-pay

## 2014-01-10 ENCOUNTER — Other Ambulatory Visit (INDEPENDENT_AMBULATORY_CARE_PROVIDER_SITE_OTHER): Payer: Self-pay | Admitting: General Surgery

## 2014-01-10 MED ORDER — VENLAFAXINE HCL ER 37.5 MG PO CP24: 37.5000 mg | ORAL_CAPSULE | Freq: Every day | ORAL | Status: DC

## 2014-01-10 NOTE — Telephone Encounter (Signed)
I put her in for 3 refills of effexor.

## 2014-01-10 NOTE — Telephone Encounter (Signed)
Called Amanda Coleman to notify her that [name] did refill the Effexor for 3 refills at her pharmacy.

## 2014-01-10 NOTE — Telephone Encounter (Signed)
Pt calling in to report that she is doing so much better since you prescribed the Effexor. The pt has been seeing PT as well which has been improving also. The pt wants to know if you would give her a refill on the Effexor b/c she only has three pills left and would you be the one managing the Effexor. The pt didn't know if the Effexor would have to come from the [facility]. I made a f/u appt for the pt see you on 01/23/14 b/c you wanted to see her in June. Please advise.

## 2014-01-11 ENCOUNTER — Encounter: Payer: Self-pay | Admitting: Oncology

## 2014-01-11 ENCOUNTER — Ambulatory Visit: Payer: BC Managed Care – PPO | Admitting: Physical Therapy

## 2014-01-11 NOTE — Progress Notes (Signed)
Per notes on this account the claims have been paid by [facility] on 11/23/13.   Their website is down today so tomorrow I can get the eobs for payment posting. Per billing in reference to [id number]

## 2014-01-12 NOTE — Telephone Encounter (Signed)
Called pt to advise her that [name] will discuss the Effexor when pt comes in for her appt in June. The pt understands.

## 2014-01-16 ENCOUNTER — Encounter: Payer: BC Managed Care – PPO | Admitting: Physical Therapy

## 2014-01-16 ENCOUNTER — Ambulatory Visit: Payer: BC Managed Care – PPO | Attending: General Surgery | Admitting: Physical Therapy

## 2014-01-16 DIAGNOSIS — M24519 Contracture, unspecified shoulder: Secondary | ICD-10-CM | POA: Insufficient documentation

## 2014-01-16 DIAGNOSIS — IMO0001 Reserved for inherently not codable concepts without codable children: Secondary | ICD-10-CM | POA: Insufficient documentation

## 2014-01-18 ENCOUNTER — Ambulatory Visit: Payer: BC Managed Care – PPO | Admitting: Physical Therapy

## 2014-01-18 DIAGNOSIS — IMO0001 Reserved for inherently not codable concepts without codable children: Secondary | ICD-10-CM | POA: Diagnosis not present

## 2014-01-23 ENCOUNTER — Ambulatory Visit (INDEPENDENT_AMBULATORY_CARE_PROVIDER_SITE_OTHER): Payer: BC Managed Care – PPO | Admitting: General Surgery

## 2014-01-23 ENCOUNTER — Encounter: Payer: BC Managed Care – PPO | Admitting: Physical Therapy

## 2014-01-23 ENCOUNTER — Encounter (INDEPENDENT_AMBULATORY_CARE_PROVIDER_SITE_OTHER): Payer: Self-pay | Admitting: General Surgery

## 2014-01-23 VITALS — BP 126/80 | HR 76 | Temp 98.0°F | Resp 18 | Ht 64.0 in | Wt 145.0 lb

## 2014-01-23 DIAGNOSIS — C50519 Malignant neoplasm of lower-outer quadrant of unspecified female breast: Secondary | ICD-10-CM

## 2014-01-23 DIAGNOSIS — C50512 Malignant neoplasm of lower-outer quadrant of left female breast: Secondary | ICD-10-CM

## 2014-01-23 NOTE — Progress Notes (Signed)
Subjective:     Patient ID: Amanda Coleman, female   DOB: 09/01/1965, 48 y.o.   MRN: [id number]  HPI This is a 48 year old female who recently underwent a left simple mastectomy and sentinel node biopsy for a stage II breast cancer. She has undergone chemotherapy and is now on tamoxifen. She is having pretty significant night sweats and is not sleeping well. She is having some difficulty with her situation now. 3 weeks ago she underwent exchange for implant and this was smaller implant.  She said there is some concern about healing on the left side and she is very emotional about this today   Review of Systems     Objective:   Physical Exam Pulmonary/Chest: Right breast exhibits no inverted nipple, no mass, no nipple discharge, no skin change and no tenderness.    Lymphadenopathy:  She has no cervical adenopathy.  She has no axillary adenopathy.  Right: No supraclavicular adenopathy present.  Left: No supraclavicular adenopathy present.     Assessment:     Stage II left breast cancer     Plan:     1. She is doing well without any evidence of recurrence. Will continue left sided yearly mm, monthly sbe and q 6 months clinical exams  2. Her reconstruction is doing well and she is happy 3. effexor is doing well and we will continue this 4. I will see back in a few months.

## 2014-01-24 ENCOUNTER — Other Ambulatory Visit: Payer: Self-pay | Admitting: *Deleted

## 2014-01-24 DIAGNOSIS — C50512 Malignant neoplasm of lower-outer quadrant of left female breast: Secondary | ICD-10-CM

## 2014-01-25 ENCOUNTER — Other Ambulatory Visit: Payer: Self-pay

## 2014-01-25 ENCOUNTER — Ambulatory Visit (HOSPITAL_BASED_OUTPATIENT_CLINIC_OR_DEPARTMENT_OTHER): Payer: BC Managed Care – PPO | Admitting: Hematology and Oncology

## 2014-01-25 ENCOUNTER — Encounter: Payer: BC Managed Care – PPO | Admitting: Physical Therapy

## 2014-01-25 ENCOUNTER — Telehealth: Payer: Self-pay | Admitting: Hematology and Oncology

## 2014-01-25 ENCOUNTER — Ambulatory Visit: Payer: Self-pay

## 2014-01-25 ENCOUNTER — Other Ambulatory Visit (HOSPITAL_BASED_OUTPATIENT_CLINIC_OR_DEPARTMENT_OTHER): Payer: BC Managed Care – PPO

## 2014-01-25 VITALS — BP 137/89 | HR 76 | Temp 98.9°F | Resp 18 | Ht 64.0 in | Wt 144.4 lb

## 2014-01-25 DIAGNOSIS — C50512 Malignant neoplasm of lower-outer quadrant of left female breast: Secondary | ICD-10-CM

## 2014-01-25 DIAGNOSIS — C50319 Malignant neoplasm of lower-inner quadrant of unspecified female breast: Secondary | ICD-10-CM

## 2014-01-25 DIAGNOSIS — C50919 Malignant neoplasm of unspecified site of unspecified female breast: Secondary | ICD-10-CM

## 2014-01-25 DIAGNOSIS — Z170 Estrogen receptor positive status [ER+]: Secondary | ICD-10-CM

## 2014-01-25 DIAGNOSIS — N951 Menopausal and female climacteric states: Secondary | ICD-10-CM

## 2014-01-25 LAB — COMPREHENSIVE METABOLIC PANEL (CC13)
ALBUMIN: 4.2 g/dL (ref 3.5–5.0)
ALK PHOS: 62 U/L (ref 40–150)
ALT: 18 U/L (ref 0–55)
AST: 20 U/L (ref 5–34)
Anion Gap: 10 mEq/L (ref 3–11)
BUN: 11.6 mg/dL (ref 7.0–26.0)
CALCIUM: 9.2 mg/dL (ref 8.4–10.4)
CHLORIDE: 109 meq/L (ref 98–109)
CO2: 25 mEq/L (ref 22–29)
CREATININE: 0.8 mg/dL (ref 0.6–1.1)
Glucose: 102 mg/dl (ref 70–140)
Potassium: 3.8 mEq/L (ref 3.5–5.1)
Sodium: 145 mEq/L (ref 136–145)
Total Bilirubin: 0.61 mg/dL (ref 0.20–1.20)
Total Protein: 7 g/dL (ref 6.4–8.3)

## 2014-01-25 LAB — CBC WITH DIFFERENTIAL/PLATELET
BASO%: 0.8 % (ref 0.0–2.0)
BASOS ABS: 0 10*3/uL (ref 0.0–0.1)
EOS ABS: 0 10*3/uL (ref 0.0–0.5)
EOS%: 0.7 % (ref 0.0–7.0)
HEMATOCRIT: 40.5 % (ref 34.8–46.6)
HGB: 13.5 g/dL (ref 11.6–15.9)
LYMPH%: 23.6 % (ref 14.0–49.7)
MCH: 29.4 pg (ref 25.1–34.0)
MCHC: 33.3 g/dL (ref 31.5–36.0)
MCV: 88.2 fL (ref 79.5–101.0)
MONO#: 0.2 10*3/uL (ref 0.1–0.9)
MONO%: 5.1 % (ref 0.0–14.0)
NEUT#: 3.3 10*3/uL (ref 1.5–6.5)
NEUT%: 69.8 % (ref 38.4–76.8)
Platelets: 226 10*3/uL (ref 145–400)
RBC: 4.59 10*6/uL (ref 3.70–5.45)
RDW: 13.8 % (ref 11.2–14.5)
WBC: 4.8 10*3/uL (ref 3.9–10.3)
lymph#: 1.1 10*3/uL (ref 0.9–3.3)

## 2014-01-25 NOTE — Telephone Encounter (Signed)
per pof to send pt to lab-per [facility] to sch pt w/GM in Sept-pt req to see him on returned visits-prev [facility] pt-sch and gave pt copy of sch

## 2014-01-25 NOTE — Progress Notes (Signed)
Amanda Coleman  [phone] [phone]  OFFICE PROGRESS NOTE    ID: Amanda Coleman   DOB: 03/05/1966  MR#: [id number]  CSN#:[id number]   PCP: [name],[name], MD RAD ONC: [name], M.D.  SU: [name], M.D. SU: [name], D.O.   DIAGNOSIS: Amanda Coleman is a 48 y.o. 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 Patient was seen in the multidisciplinary clinic on 03/29/2013.   STAGE:  Breast cancer of lower-outer quadrant of left female breast  Primary site: Breast (Left)  Staging method: AJCC 7th Edition  Clinical: Stage IIA (T2, N0, cM0)  Summary: Stage IIA (T2, N0, cM0)   PRIOR THERAPY: As per previously dictated Dr. Khan's note:  #1  For 3-4 months experienced a left nipple bloody discharge. She had a recent mammogram that did not show a mass. She had a physical examination which did show a mass measuring 3.4 cm at the 7 o'clock position. MRI on 03/24/2013 revealed the mass to be 5.0 cm. She went on to have a biopsy performed at the 7 o'clock position that showed invasive ductal carcinoma grade 1. Prognostic markers revealed the tumor to be estrogen receptor positive, progesterone receptor positive, HER-2/neu negative with a low Ki-67 11%.  #2   status post mastectomy as of 04/13/2013. The final pathology did reveal invasive ductal carcinoma with ductal carcinoma in situ. Tumor is ER positive. We sent her tumor for Oncotype testing, but insufficient amount of tumor specimen meant testing could not be performed.  Patient's Harrel was discussed again at the multidisciplinary breast conference. It was recommended by the team because there was significant amount of invasive disease that she should get chemotherapy.    #3 status post adjuvant chemotherapy consisting of Taxotere Cytoxan given every 21 days for total of 4 cycles. Chemotherapy was administered from 05/25/2013 through 07/28/2013. Overall  patient tolerated treatment well. She did receive Neulasta on day 2 of each cycle for a total of 4 injections.  #4 patient is undergoing reconstruction of the left breast by Dr. [name].  #5 adjuvant curative intent tamoxifen 20 mg daily begun 08/21/2013.planned for a total of 10 years .   CURRENT THERAPY:  curative intent tamoxifen 20 mg daily   INTERVAL HISTORY: Amanda Coleman is a 48 y.o. female who returns for followup visit for breast cancer. She started on tamoxifen on 08/21/2013.  since the time of last visit she has seen on Dr. Wakefield for followup. She was started on effexor for  hot flashes and she feels much better However she started having  dryness in the mouth and also whenever she goes to the sun she feels staining sensation. She says her hair is turning gray.  she noticed scarcity of hair growth of the  eyebrows and eyelashes  better than before.  She denies any nausea, vomiting, headaches, fevers, chills no night sweats no  blood in the stool or urine .  she denies any weight loss or decrease in appetite. She denies and chest pain palpitations. she does a good exercise .    MEDICAL HISTORY: Past Medical History  Diagnosis Date  . Thyroid disease   . Breast cancer   . Hashimoto's disease   . Hypothyroidism   . PONV (postoperative nausea and vomiting)   . GERD (gastroesophageal reflux disease)     ALLERGIES:   Allergies  Allergen Reactions  . Penicillins Swelling  . Sulfa Antibiotics Rash  .   Sulfamethoxazole Rash    MEDICATIONS:  Current Outpatient Prescriptions  Medication Sig Dispense Refill  . ALPRAZolam (XANAX) 0.5 MG tablet Take 0.5 mg by mouth as needed for sleep.      Marland Kitchen levothyroxine (SYNTHROID, LEVOTHROID) 112 MCG tablet Take 125 mcg by mouth daily before breakfast.       . Multiple Vitamins-Minerals (MULTIVITAMIN WITH MINERALS) tablet Take 1 tablet by mouth daily.      . tamoxifen (NOLVADEX) 20 MG tablet Take 1 tablet (20 mg total) by mouth daily.   30 tablet  12  . venlafaxine XR (EFFEXOR XR) 37.5 MG 24 hr capsule Take 1 capsule (37.5 mg total) by mouth at bedtime.  30 capsule  3  . zolpidem (AMBIEN) 10 MG tablet Take 10 mg by mouth at bedtime as needed for sleep.       No current facility-administered medications for this visit.    SURGICAL HISTORY:  Past Surgical History  Procedure Laterality Date  . Abdominal hysterectomy    . Gum graft    . Simple mastectomy with axillary sentinel node biopsy Left 04/13/2013    [name]  . Mastectomy w/ sentinel node biopsy Left 04/13/2013    Procedure: LEFT SKIN SPARING MASTECTOMY WITH LEFT AXILLARY SENTINEL NODE BIOPSY;  Surgeon: [name], MD;  Location: [place];  Service: General;  Laterality: Left;  . Breast reconstruction with placement of tissue expander and flex hd (acellular hydrated dermis) Left 04/13/2013    Procedure: LEFT BREAST RECONSTRUCTION WITH PLACEMENT OF TISSUE EXPANDER AND FLEX HD (ACELLULAR HYDRATED DERMIS);  Surgeon: [name], DO;  Location: [facility];  Service: Plastics;  Laterality: Left;  . Portacath placement N/A 05/18/2013    Procedure: INSERTION PORT-A-CATH;  Surgeon: [name], MD;  Location: [facility];  Service: General;  Laterality: N/A;  . Mass excision Left 08/24/2013    Procedure: EXCISION OF WOUND LEFT BREAST WITH CLOSURE ;  Surgeon: [name], DO;  Location: [facility];  Service: Plastics;  Laterality: Left;  . Removal of tissue expander and placement of implant Left 11/23/2013    Procedure: REMOVAL OF LEFT  TISSUE EXPANDER WITH  PLACEMENT OF IMPLANT TO LEFT BREAST,AND REMOVAL OF PORT ON RIGHT;  Surgeon: [name], DO;  Location: [facility];  Service: Plastics;  Laterality: Left;    REVIEW OF SYSTEMS:  a detailed 10 point review of systems is been assessed and pertinent symptoms as mentioned in interval history  PHYSICAL EXAMINATION: Blood pressure 137/89, pulse 76, temperature 98.9 F (37.2 C), temperature  source Oral, resp. rate 18, height 5' 4" (1.626 m), weight 144 lb 6.4 oz (65.499 kg). Body mass index is 24.77 kg/(m^2). General: Patient is a well appearing female in no acute distress HEENT: PERRLA, sclerae anicteric no conjunctival pallor, MMM Neck: supple, no palpable adenopathy Lungs: clear to auscultation bilaterally, no wheezes, rhonchi, or rales Cardiovascular: regular rate rhythm, S1, S2, no murmurs, rubs or gallops Abdomen: Soft, non-tender, non-distended, normoactive bowel sounds, no HSM Extremities: warm and well perfused, no clubbing, cyanosis, or edema Skin: No rashes or lesions Neuro: Non-focal Breast examination: Right breast no masses felt, left breast status post implant, no bilateral axillary lymphadenopathy   ECOG PERFORMANCE STATUS: 1 - Symptomatic but completely ambulatory  LABORATORY DATA: Lab Results  Component Value Date   WBC 4.8 01/25/2014   HGB 13.5 01/25/2014   HCT 40.5 01/25/2014   MCV 88.2 01/25/2014   PLT 226 01/25/2014      Chemistry  Component Value Date/Time   NA 145 01/25/2014 1029   NA 137 06/23/2013 1522   K 3.8 01/25/2014 1029   K 4.2 06/23/2013 1522   CL 102 06/23/2013 1522   CO2 25 01/25/2014 1029   CO2 28 06/23/2013 1522   BUN 11.6 01/25/2014 1029   BUN 11 06/23/2013 1522   CREATININE 0.8 01/25/2014 1029   CREATININE 0.70 06/23/2013 1522      Component Value Date/Time   CALCIUM 9.2 01/25/2014 1029   CALCIUM 8.9 06/23/2013 1522   ALKPHOS 62 01/25/2014 1029   ALKPHOS 110 06/23/2013 1522   AST 20 01/25/2014 1029   AST 34 06/23/2013 1522   ALT 18 01/25/2014 1029   ALT 23 06/23/2013 1522   BILITOT 0.61 01/25/2014 1029   BILITOT 0.1* 06/23/2013 1522       RADIOGRAPHIC STUDIES: No results found.    ASSESSMENT: Laelah M Rake is a 48 y.o. female with:  #1  For 3-4 months experienced a left nipple bloody discharge. She had a recent mammogram that did not show a mass. She had a physical examination which did show a mass measuring 3.4 cm at the 7  o'clock position. MRI on 03/24/2013 revealed the mass to be 5.0 cm. She went on to have a biopsy performed at the 7 o'clock position that showed invasive ductal carcinoma grade 1. Prognostic markers revealed the tumor to be estrogen receptor positive, progesterone receptor positive, HER-2/neu negative with a low Ki-67 11%.  #2 Status post left breast mastectomy on 04/13/2013. The final pathology revealed estrogen receptor positive, invasive ductal carcinoma with The tumor was sent for Oncotype testing, but an insufficient amount of tumor specimen meant testing could not be performed.  Patient's Miao was discussed again at the multidisciplinary breast conference. It was recommended by the team because there was significant amount of invasive disease, Mrs. Beier should receive chemotherapy.  Dr. Khan discussed with the patient and her husband the role of chemotherapy.  #3  Adjuvant chemotherapy consisting of every 3 week Taxotere/Cytoxan x 4 cycles with granulocyte support with Neulasta injection on day 2 of each cycle from 05/25/2013 through 07/27/13.  #4 curative intent tamoxifen 20 mg begun 08/21/2013   PLAsN:  Delesia"s  hot flashes are getting better with Effexor. Continue Effexor. I have asked the patient to increase Effexor if needed for  flashes. She would like to wait some time before deciding to go up on the dose of Effexor. Continue tamoxifen for total of 10 years. I have reviewed labs with her. No clinical evidence of breast cancer noted at this time. We'll schedule for annual right breast mammogram in August 2015. Next followup visit in 6 months with CBC and differential and CMP on the day of next visit Follow up with Dr. Sanger and Dr. Wakefield as scheduled  Janesa is in agreement with the current plan of care and she would call our office with any questions or concerns prior to next visit  I spent 20 minutes counseling the patient face to face.  The total time spent in the appointment was 30  minutes.   Padma Kamineni, MD Medical/Oncology [facility] [phone] (Office)  01/25/2014, 12:17 PM 

## 2014-01-26 ENCOUNTER — Encounter: Payer: Self-pay | Admitting: Hematology and Oncology

## 2014-01-26 ENCOUNTER — Ambulatory Visit (INDEPENDENT_AMBULATORY_CARE_PROVIDER_SITE_OTHER): Payer: Self-pay | Admitting: General Surgery

## 2014-01-26 NOTE — Progress Notes (Signed)
Insurance is paying Neulasta 100% °

## 2014-03-22 ENCOUNTER — Ambulatory Visit
Admission: RE | Admit: 2014-03-22 | Discharge: 2014-03-22 | Disposition: A | Discharge status: Home / Self Care | Payer: BC Managed Care – PPO | Source: Ambulatory Visit | Attending: Hematology and Oncology | Admitting: Hematology and Oncology

## 2014-03-22 ENCOUNTER — Other Ambulatory Visit: Payer: Self-pay | Admitting: Hematology and Oncology

## 2014-03-22 DIAGNOSIS — C50512 Malignant neoplasm of lower-outer quadrant of left female breast: Secondary | ICD-10-CM

## 2014-06-07 ENCOUNTER — Other Ambulatory Visit: Payer: Self-pay | Admitting: Obstetrics & Gynecology

## 2014-06-08 LAB — CYTOLOGY - PAP

## 2014-07-20 ENCOUNTER — Other Ambulatory Visit: Payer: Self-pay | Admitting: *Deleted

## 2014-07-20 DIAGNOSIS — C50512 Malignant neoplasm of lower-outer quadrant of left female breast: Secondary | ICD-10-CM

## 2014-07-23 ENCOUNTER — Telehealth: Payer: Self-pay | Admitting: Hematology and Oncology

## 2014-07-23 ENCOUNTER — Other Ambulatory Visit (HOSPITAL_BASED_OUTPATIENT_CLINIC_OR_DEPARTMENT_OTHER): Payer: BC Managed Care – PPO

## 2014-07-23 ENCOUNTER — Ambulatory Visit (HOSPITAL_BASED_OUTPATIENT_CLINIC_OR_DEPARTMENT_OTHER): Payer: BC Managed Care – PPO | Admitting: Hematology and Oncology

## 2014-07-23 VITALS — BP 143/71 | HR 66 | Temp 98.7°F | Resp 18 | Ht 64.0 in | Wt 146.6 lb

## 2014-07-23 DIAGNOSIS — C50512 Malignant neoplasm of lower-outer quadrant of left female breast: Secondary | ICD-10-CM

## 2014-07-23 DIAGNOSIS — C50312 Malignant neoplasm of lower-inner quadrant of left female breast: Secondary | ICD-10-CM

## 2014-07-23 DIAGNOSIS — Z170 Estrogen receptor positive status [ER+]: Secondary | ICD-10-CM

## 2014-07-23 DIAGNOSIS — N951 Menopausal and female climacteric states: Secondary | ICD-10-CM

## 2014-07-23 LAB — CBC WITH DIFFERENTIAL/PLATELET
BASO%: 0.5 % (ref 0.0–2.0)
Basophils Absolute: 0 10*3/uL (ref 0.0–0.1)
EOS%: 1.8 % (ref 0.0–7.0)
Eosinophils Absolute: 0.1 10*3/uL (ref 0.0–0.5)
HCT: 39.9 % (ref 34.8–46.6)
HGB: 12.9 g/dL (ref 11.6–15.9)
LYMPH#: 1.2 10*3/uL (ref 0.9–3.3)
LYMPH%: 24.9 % (ref 14.0–49.7)
MCH: 29.5 pg (ref 25.1–34.0)
MCHC: 32.4 g/dL (ref 31.5–36.0)
MCV: 90.9 fL (ref 79.5–101.0)
MONO#: 0.2 10*3/uL (ref 0.1–0.9)
MONO%: 4.9 % (ref 0.0–14.0)
NEUT#: 3.3 10*3/uL (ref 1.5–6.5)
NEUT%: 67.9 % (ref 38.4–76.8)
Platelets: 218 10*3/uL (ref 145–400)
RBC: 4.39 10*6/uL (ref 3.70–5.45)
RDW: 12.6 % (ref 11.2–14.5)
WBC: 4.9 10*3/uL (ref 3.9–10.3)

## 2014-07-23 LAB — COMPREHENSIVE METABOLIC PANEL (CC13)
ALBUMIN: 3.9 g/dL (ref 3.5–5.0)
ALT: 40 U/L (ref 0–55)
ANION GAP: 9 meq/L (ref 3–11)
AST: 27 U/L (ref 5–34)
Alkaline Phosphatase: 67 U/L (ref 40–150)
BUN: 14.8 mg/dL (ref 7.0–26.0)
CHLORIDE: 105 meq/L (ref 98–109)
CO2: 28 mEq/L (ref 22–29)
Calcium: 9.4 mg/dL (ref 8.4–10.4)
Creatinine: 0.7 mg/dL (ref 0.6–1.1)
EGFR: >90 mL/min/{1.73_m2} (ref >90)
Glucose: 89 mg/dl (ref 70–140)
POTASSIUM: 4.2 meq/L (ref 3.5–5.1)
Sodium: 142 mEq/L (ref 136–145)
TOTAL PROTEIN: 6.5 g/dL (ref 6.4–8.3)
Total Bilirubin: 0.31 mg/dL (ref 0.20–1.20)

## 2014-07-23 NOTE — Assessment & Plan Note (Addendum)
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 Status post mastectomy followed by systemic chemotherapy and is currently on tamoxifen that started 08/21/2013 the plan of treatment 10 years  Side effects of tamoxifen: 1. Hot flashes: Currently on Effexor. 2. Occasional bone pain at night 3. Occasional nausea: Patient does not think that this is a problem and it is very seldom he is in he and and an and he and he and he and he and and a the and and he and and an and in and and an and he 4. Difficulty with sleep: I provided her information regarding guided imagery for stress relaxation  Surveillance: I reviewed the mammogram done 03/31/2014 on the right side which was normal. Today's breast exam was also normal. Survivorship:Discussed the importance of physical exercise in decreasing the likelihood of breast cancer recurrence. Recommended 30 mins daily 6 days a week of either brisk walking or cycling or swimming. Encouraged patient to eat more fruits and vegetables and decrease red meat.  Patient is already doing yoga which has been helping her.I discussed the role of curcumin, organic meats milk yogurt, probiotics etc. To help with her immune system.  Return to clinic in 6 months for followup

## 2014-07-23 NOTE — Telephone Encounter (Signed)
per pof to sch pt appt-gave pt copy of sch °

## 2014-07-23 NOTE — Progress Notes (Signed)
Patient Care Team: Maisie Fus, MD as PCP - General (Obstetrics and Gynecology)  DIAGNOSIS: Breast cancer of lower-outer quadrant of left female breast   Staging form: Breast, AJCC 7th Edition     Clinical: Stage IIA (T2, N0, cM0) - Unsigned       Staging comments: Staged at breast conference 03/29/13      Pathologic: No stage assigned - Unsigned   SUMMARY OF ONCOLOGIC HISTORY:   Breast cancer of lower-outer quadrant of left female breast   03/16/2013 Initial Biopsy Invasive ductal carcinoma grade 1, ER positive, PR positive, HER-2 negative, Ki-67 11%   03/24/2013 Breast MRI Left breast bloody discharge mammogram mass 3.4 cm at 7:00, MRI revealed mass to be 5 cm   04/13/2013 Surgery Left mastectomy: invasive ductal carcinoma 4.2 cm, grade 2, intermediate grade DCIS, 3 SLN negative, ER 100%, PR 100%, HER-2 negative ratio, insufficient tissue for Oncotype DX testing: Followed by breast reconstruction by Dr. Migdalia Dk   05/25/2013 - 07/28/2013 Chemotherapy Adjuvant chemotherapy with Taxotere and Cytoxan every 21 days x4 cycles   08/21/2013 -  Anti-estrogen oral therapy Tamoxifen 20 mg daily plan is for 10 years    CHIEF COMPLIANT: followup of breast cancer  INTERVAL HISTORY: Amanda Coleman is a 48 year old Caucasian with above-mentioned history of left-sided breast cancer treated with left mastectomy and reconstruction followed by adjuvant chemotherapy and is currently on tamoxifen. She is tolerating tamoxifen fairly well without any major problems or concerns other than occasional hot flashes which are responding to Effexor.  REVIEW OF SYSTEMS:   Constitutional: Denies fevers, chills or abnormal weight loss Eyes: Denies blurriness of vision Ears, nose, mouth, throat, and face: Denies mucositis or sore throat Respiratory: Denies cough, dyspnea or wheezes Cardiovascular: Denies palpitation, chest discomfort or lower extremity swelling Gastrointestinal:  Denies nausea, heartburn or change in bowel  habits Skin: Denies abnormal skin rashes Lymphatics: Denies new lymphadenopathy or easy bruising Neurological:Denies numbness, tingling or new weaknesses Behavioral/Psych: Mood is stable, no new changes  Breast:  denies any pain or lumps or nodules in either breasts All other systems were reviewed with the patient and are negative.  I have reviewed the past medical history, past surgical history, social history and family history with the patient and they are unchanged from previous note.  ALLERGIES:  is allergic to penicillins; sulfa antibiotics; and sulfamethoxazole.  MEDICATIONS:  Current Outpatient Prescriptions  Medication Sig Dispense Refill  . ALPRAZolam (XANAX) 0.5 MG tablet Take 0.5 mg by mouth as needed for sleep.    Marland Kitchen levothyroxine (SYNTHROID, LEVOTHROID) 125 MCG tablet Take 125 mcg by mouth daily before breakfast.    . Multiple Vitamins-Minerals (MULTIVITAMIN WITH MINERALS) tablet Take 1 tablet by mouth daily.    . tamoxifen (NOLVADEX) 20 MG tablet Take 1 tablet (20 mg total) by mouth daily. 30 tablet 12  . venlafaxine XR (EFFEXOR XR) 37.5 MG 24 hr capsule Take 1 capsule (37.5 mg total) by mouth at bedtime. 30 capsule 3  . zolpidem (AMBIEN) 10 MG tablet Take 10 mg by mouth at bedtime as needed for sleep.     No current facility-administered medications for this visit.    PHYSICAL EXAMINATION: ECOG PERFORMANCE STATUS: 1 - Symptomatic but completely ambulatory  Filed Vitals:   07/23/14 0956  BP: 143/71  Pulse: 66  Temp: 98.7 F (37.1 C)  Resp: 18   Filed Weights   07/23/14 0956  Weight: 146 lb 9.6 oz (66.497 kg)    GENERAL:alert, no distress and comfortable SKIN:  skin color, texture, turgor are normal, no rashes or significant lesions EYES: normal, Conjunctiva are pink and non-injected, sclera clear OROPHARYNX:no exudate, no erythema and lips, buccal mucosa, and tongue normal  NECK: supple, thyroid normal size, non-tender, without nodularity LYMPH:  no palpable  lymphadenopathy in the cervical, axillary or inguinal LUNGS: clear to auscultation and percussion with normal breathing effort HEART: regular rate & rhythm and no murmurs and no lower extremity edema ABDOMEN:abdomen soft, non-tender and normal bowel sounds Musculoskeletal:no cyanosis of digits and no clubbing  NEURO: alert & oriented x 3 with fluent speech, no focal motor/sensory deficits BREAST: No palpable masses or nodules in either right breasts. No palpable axillary supraclavicular or infraclavicular adenopathy no breast tenderness or nipple discharge. Left breast reconstructed   LABORATORY DATA:  I have reviewed the data as listed   Chemistry      Component Value Date/Time   NA 142 07/23/2014 0934   NA 137 06/23/2013 1522   K 4.2 07/23/2014 0934   K 4.2 06/23/2013 1522   CL 102 06/23/2013 1522   CO2 28 07/23/2014 0934   CO2 28 06/23/2013 1522   BUN 14.8 07/23/2014 0934   BUN 11 06/23/2013 1522   CREATININE 0.7 07/23/2014 0934   CREATININE 0.70 06/23/2013 1522      Component Value Date/Time   CALCIUM 9.4 07/23/2014 0934   CALCIUM 8.9 06/23/2013 1522   ALKPHOS 67 07/23/2014 0934   ALKPHOS 110 06/23/2013 1522   AST 27 07/23/2014 0934   AST 34 06/23/2013 1522   ALT 40 07/23/2014 0934   ALT 23 06/23/2013 1522   BILITOT 0.31 07/23/2014 0934   BILITOT 0.1* 06/23/2013 1522       Lab Results  Component Value Date   WBC 4.9 07/23/2014   HGB 12.9 07/23/2014   HCT 39.9 07/23/2014   MCV 90.9 07/23/2014   PLT 218 07/23/2014   NEUTROABS 3.3 07/23/2014     RADIOGRAPHIC STUDIES: I have personally reviewed the radiology reports and agreed with their findings. No results found.   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 Status post mastectomy followed by  systemic chemotherapy and is currently on tamoxifen that started 08/21/2013 the plan of treatment 10 years  Side effects of tamoxifen: 1. Hot flashes: Currently on Effexor. 2. Occasional bone pain at night 3. Occasional nausea: Patient does not think that this is a problem and it is very seldom he is in he and and an and he and he and he and he and and a the and and he and and an and in and and an and he 4. Difficulty with sleep: I provided her information regarding guided imagery for stress relaxation  Surveillance: I reviewed the mammogram done 03/31/2014 on the right side which was normal. Today's breast exam was also normal. Survivorship:Discussed the importance of physical exercise in decreasing the likelihood of breast cancer recurrence. Recommended 30 mins daily 6 days a week of either brisk walking or cycling or swimming. Encouraged patient to eat more fruits and vegetables and decrease red meat.  Patient is already doing yoga which has been helping her.I discussed the role of curcumin, organic meats milk yogurt, probiotics etc. To help with her immune system.  Return to clinic in 6 months for followup  Orders Placed This Encounter  Procedures  . CBC with Differential    Standing Status: Future     Number of Occurrences:      Standing Expiration Date: 07/23/2015  . Comprehensive metabolic panel (Cmet) - [facility]    Standing Status: Future     Number of Occurrences:      Standing Expiration Date: 07/23/2015   The patient has a good understanding of the overall plan. she agrees with it. She will call with any problems that may develop before her next visit here.   Rulon Eisenmenger, MD 07/23/2014 10:51 AM

## 2014-09-06 ENCOUNTER — Other Ambulatory Visit: Payer: Self-pay | Admitting: *Deleted

## 2014-09-06 DIAGNOSIS — C50919 Malignant neoplasm of unspecified site of unspecified female breast: Secondary | ICD-10-CM

## 2014-09-06 MED ORDER — VENLAFAXINE HCL ER 37.5 MG PO CP24: 37.5000 mg | ORAL_CAPSULE | Freq: Every day | ORAL | Status: DC

## 2014-09-06 MED ORDER — TAMOXIFEN CITRATE 20 MG PO TABS: 20.0000 mg | ORAL_TABLET | Freq: Every day | ORAL | Status: DC

## 2014-09-06 NOTE — Telephone Encounter (Signed)
Pt called for r/f on tamoxifen and Effexor. Called into her preferred pharmacy her scripts. Pt denies further needs at this time.

## 2014-09-17 ENCOUNTER — Telehealth: Payer: Self-pay | Admitting: Hematology and Oncology

## 2014-09-17 NOTE — Telephone Encounter (Signed)
due to PAL moved 6/14 appt to 6/21. s/w pt and per pt appt was moved from 6/21 to 6/23. pt has new d/t.

## 2015-01-29 ENCOUNTER — Ambulatory Visit: Payer: BC Managed Care – PPO | Admitting: Hematology and Oncology

## 2015-02-05 ENCOUNTER — Ambulatory Visit: Payer: Self-pay | Admitting: Hematology and Oncology

## 2015-02-05 ENCOUNTER — Other Ambulatory Visit: Payer: Self-pay

## 2015-02-05 DIAGNOSIS — Z1231 Encounter for screening mammogram for malignant neoplasm of breast: Secondary | ICD-10-CM

## 2015-02-06 NOTE — Assessment & Plan Note (Signed)
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 Status post mastectomy followed by systemic chemotherapy and is currently on tamoxifen that started 08/21/2013 the plan of treatment 10 years  Side effects of tamoxifen: 1. Hot flashes: Currently on Effexor. 2. Occasional bone pain at night 3. Occasional nausea:  4. Difficulty with sleep: I provided her information regarding guided imagery for stress relaxation  Surveillance: I reviewed the mammogram done 03/31/2014 on the right side which was normal. Today's breast exam was also normal.

## 2015-02-07 ENCOUNTER — Encounter: Payer: Self-pay | Admitting: Hematology and Oncology

## 2015-02-07 ENCOUNTER — Ambulatory Visit (HOSPITAL_BASED_OUTPATIENT_CLINIC_OR_DEPARTMENT_OTHER): Payer: BLUE CROSS/BLUE SHIELD | Admitting: Hematology and Oncology

## 2015-02-07 ENCOUNTER — Telehealth: Payer: Self-pay | Admitting: Hematology and Oncology

## 2015-02-07 VITALS — BP 142/77 | HR 82 | Temp 98.5°F | Resp 18 | Ht 64.0 in | Wt 149.8 lb

## 2015-02-07 DIAGNOSIS — Z79810 Long term (current) use of selective estrogen receptor modulators (SERMs): Secondary | ICD-10-CM

## 2015-02-07 DIAGNOSIS — N951 Menopausal and female climacteric states: Secondary | ICD-10-CM

## 2015-02-07 DIAGNOSIS — C50512 Malignant neoplasm of lower-outer quadrant of left female breast: Secondary | ICD-10-CM | POA: Diagnosis not present

## 2015-02-07 DIAGNOSIS — Z170 Estrogen receptor positive status [ER+]: Secondary | ICD-10-CM

## 2015-02-07 NOTE — Telephone Encounter (Signed)
Gave avs & calendar for December. °

## 2015-02-07 NOTE — Progress Notes (Signed)
Patient Care Team: Maisie Fus, MD as PCP - General (Obstetrics and Gynecology)  DIAGNOSIS: Breast cancer of lower-outer quadrant of left female breast   Staging form: Breast, AJCC 7th Edition     Clinical: Stage IIA (T2, N0, cM0) - Unsigned       Staging comments: Staged at breast conference 03/29/13      Pathologic: No stage assigned - Unsigned   SUMMARY OF ONCOLOGIC HISTORY:   Breast cancer of lower-outer quadrant of left female breast   03/16/2013 Initial Biopsy Invasive ductal carcinoma grade 1, ER positive, PR positive, HER-2 negative, Ki-67 11%   03/24/2013 Breast MRI Left breast bloody discharge mammogram mass 3.4 cm at 7:00, MRI revealed mass to be 5 cm   04/13/2013 Surgery Left mastectomy: invasive ductal carcinoma 4.2 cm, grade 2, intermediate grade DCIS, 3 SLN negative, ER 100%, PR 100%, HER-2 negative ratio, insufficient tissue for Oncotype DX testing: Followed by breast reconstruction by Dr. Migdalia Dk   05/25/2013 - 07/28/2013 Chemotherapy Adjuvant chemotherapy with Taxotere and Cytoxan every 21 days x4 cycles   08/21/2013 -  Anti-estrogen oral therapy Tamoxifen 20 mg daily plan is for 10 years    CHIEF COMPLIANT: Follow-up of left breast cancer  INTERVAL HISTORY: Amanda Coleman is a 49 year old with above-mentioned history of left breast cancer currently on tamoxifen. She is tolerating it fairly well she continues to have hot flashes that have been improved by taking Effexor. She still feels some hot flashes are bothering her. She was wondering if the Effexor dose is not high enough. Denies any lumps or nodules in the breast. She did feel like an itching sensation in the midsternal area.  REVIEW OF SYSTEMS:   Constitutional: Denies fevers, chills or abnormal weight loss Eyes: Denies blurriness of vision Ears, nose, mouth, throat, and face: Denies mucositis or sore throat Respiratory: Denies cough, dyspnea or wheezes Cardiovascular: Denies palpitation, chest discomfort or lower  extremity swelling Gastrointestinal:  Denies nausea, heartburn or change in bowel habits Skin: Denies abnormal skin rashes, hot flashes Lymphatics: Denies new lymphadenopathy or easy bruising Neurological:Denies numbness, tingling or new weaknesses, bone pain in lower extremities Behavioral/Psych: Mood is stable, no new changes  Breast:  denies any pain or lumps or nodules in either breasts All other systems were reviewed with the patient and are negative.  I have reviewed the past medical history, past surgical history, social history and family history with the patient and they are unchanged from previous note.  ALLERGIES:  is allergic to penicillins; sulfa antibiotics; and sulfamethoxazole.  MEDICATIONS:  Current Outpatient Prescriptions  Medication Sig Dispense Refill  . ALPRAZolam (XANAX) 0.5 MG tablet Take 0.5 mg by mouth as needed for sleep.    Marland Kitchen levothyroxine (SYNTHROID, LEVOTHROID) 137 MCG tablet     . Multiple Vitamins-Minerals (MULTIVITAMIN WITH MINERALS) tablet Take 1 tablet by mouth daily.    . tamoxifen (NOLVADEX) 20 MG tablet Take 1 tablet (20 mg total) by mouth daily. 30 tablet 12  . venlafaxine XR (EFFEXOR XR) 37.5 MG 24 hr capsule Take 1 capsule (37.5 mg total) by mouth at bedtime. 30 capsule 12  . zolpidem (AMBIEN) 10 MG tablet Take 10 mg by mouth at bedtime as needed for sleep.     No current facility-administered medications for this visit.    PHYSICAL EXAMINATION: ECOG PERFORMANCE STATUS: 1 - Symptomatic but completely ambulatory  Filed Vitals:   02/07/15 1009  BP: 142/77  Pulse: 82  Temp: 98.5 F (36.9 C)  Resp: 18  Filed Weights   02/07/15 1009  Weight: 149 lb 12.8 oz (67.949 kg)    GENERAL:alert, no distress and comfortable SKIN: skin color, texture, turgor are normal, no rashes or significant lesions EYES: normal, Conjunctiva are pink and non-injected, sclera clear OROPHARYNX:no exudate, no erythema and lips, buccal mucosa, and tongue normal   NECK: supple, thyroid normal size, non-tender, without nodularity LYMPH:  no palpable lymphadenopathy in the cervical, axillary or inguinal LUNGS: clear to auscultation and percussion with normal breathing effort HEART: regular rate & rhythm and no murmurs and no lower extremity edema ABDOMEN:abdomen soft, non-tender and normal bowel sounds Musculoskeletal:no cyanosis of digits and no clubbing  NEURO: alert & oriented x 3 with fluent speech, no focal motor/sensory deficits BREAST: No palpable masses or nodules in either right or left breasts. No palpable axillary supraclavicular or infraclavicular adenopathy no breast tenderness or nipple discharge. (exam performed in the presence of a chaperone)  LABORATORY DATA:  I have reviewed the data as listed   Chemistry      Component Value Date/Time   NA 142 07/23/2014 0934   NA 137 06/23/2013 1522   K 4.2 07/23/2014 0934   K 4.2 06/23/2013 1522   CL 102 06/23/2013 1522   CO2 28 07/23/2014 0934   CO2 28 06/23/2013 1522   BUN 14.8 07/23/2014 0934   BUN 11 06/23/2013 1522   CREATININE 0.7 07/23/2014 0934   CREATININE 0.70 06/23/2013 1522      Component Value Date/Time   CALCIUM 9.4 07/23/2014 0934   CALCIUM 8.9 06/23/2013 1522   ALKPHOS 67 07/23/2014 0934   ALKPHOS 110 06/23/2013 1522   AST 27 07/23/2014 0934   AST 34 06/23/2013 1522   ALT 40 07/23/2014 0934   ALT 23 06/23/2013 1522   BILITOT 0.31 07/23/2014 0934   BILITOT 0.1* 06/23/2013 1522       Lab Results  Component Value Date   WBC 4.9 07/23/2014   HGB 12.9 07/23/2014   HCT 39.9 07/23/2014   MCV 90.9 07/23/2014   PLT 218 07/23/2014   NEUTROABS 3.3 07/23/2014   ASSESSMENT & PLAN:  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 Status post mastectomy followed by systemic chemotherapy  and is currently on tamoxifen that started 08/21/2013 the plan of treatment 10 years  Side effects of tamoxifen: 1. Hot flashes: Currently on Effexor.I discussed with her about switching tamoxifen from daytime to nighttime.If none of these help her hot flashes, we can consider increasing the dosage of Effexor. 2. Occasional bone pain at night: We discussed different remedies and measures. I agree with her primary care physician about starting her on magnesium supplementation to help with the bone pain. 3. Occasional nausea:  4. Difficulty with sleep: I provided her information regarding guided imagery for stress relaxation  Surveillance: I reviewed the mammogram done 03/31/2014 on the right side which was normal.  Today's breast exam was also normal.  Survivorship: I discussed importance of going back to doing yoga and exercise.she is eating very healthy and takes good care of her diet. She uses turmeric in her foods   No orders of the defined types were placed in this encounter.   The patient has a good understanding of the overall plan. she agrees with it. she will call with any problems that may develop  before the next visit here.   Rulon Eisenmenger, MD

## 2015-03-14 ENCOUNTER — Telehealth: Payer: Self-pay | Admitting: *Deleted

## 2015-03-14 NOTE — Telephone Encounter (Signed)
Pt called to relate taking the Tamoxifen and Effexor in the evening has helped with her symptoms.  Relayed information to Dr. Lindi Adie.

## 2015-03-25 ENCOUNTER — Ambulatory Visit
Admission: RE | Admit: 2015-03-25 | Discharge: 2015-03-25 | Disposition: A | Discharge status: Home / Self Care | Payer: BLUE CROSS/BLUE SHIELD | Source: Ambulatory Visit

## 2015-03-25 DIAGNOSIS — Z1231 Encounter for screening mammogram for malignant neoplasm of breast: Secondary | ICD-10-CM

## 2015-03-27 ENCOUNTER — Other Ambulatory Visit: Payer: Self-pay

## 2015-03-27 DIAGNOSIS — Z1231 Encounter for screening mammogram for malignant neoplasm of breast: Secondary | ICD-10-CM

## 2015-03-27 DIAGNOSIS — Z9012 Acquired absence of left breast and nipple: Secondary | ICD-10-CM

## 2015-07-24 NOTE — Assessment & Plan Note (Signed)
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 Status post mastectomy followed by systemic chemotherapy and is currently on tamoxifen that started 08/21/2013 the plan of treatment 10 years  Side effects of tamoxifen: 1. Hot flashes: Currently on Effexor.I discussed with her about switching tamoxifen from daytime to nighttime.If none of these help her hot flashes, we can consider increasing the dosage of Effexor. 2. Occasional bone pain at night: We discussed different remedies and measures. I agree with her primary care physician about starting her on magnesium supplementation to help with the bone pain. 3. Occasional nausea:  4. Difficulty with sleep: I provided her information regarding guided imagery for stress relaxation  Surveillance: I reviewed the mammogram done 03/26/15 on the right side which was normal.  Today's breast exam was also normal.  Survivorship: I discussed importance of going back to doing yoga and exercise.she is eating very healthy and takes good care of her diet. She uses turmeric in her foods

## 2015-07-25 ENCOUNTER — Ambulatory Visit (HOSPITAL_BASED_OUTPATIENT_CLINIC_OR_DEPARTMENT_OTHER): Payer: BLUE CROSS/BLUE SHIELD | Admitting: Hematology and Oncology

## 2015-07-25 ENCOUNTER — Other Ambulatory Visit: Payer: Self-pay | Admitting: *Deleted

## 2015-07-25 ENCOUNTER — Encounter: Payer: Self-pay | Admitting: *Deleted

## 2015-07-25 ENCOUNTER — Encounter: Payer: Self-pay | Admitting: Hematology and Oncology

## 2015-07-25 ENCOUNTER — Telehealth: Payer: Self-pay | Admitting: Hematology and Oncology

## 2015-07-25 VITALS — BP 113/60 | HR 82 | Temp 98.0°F | Resp 18 | Ht 64.0 in | Wt 159.0 lb

## 2015-07-25 DIAGNOSIS — Z170 Estrogen receptor positive status [ER+]: Secondary | ICD-10-CM

## 2015-07-25 DIAGNOSIS — C50512 Malignant neoplasm of lower-outer quadrant of left female breast: Secondary | ICD-10-CM | POA: Diagnosis not present

## 2015-07-25 DIAGNOSIS — Z79810 Long term (current) use of selective estrogen receptor modulators (SERMs): Secondary | ICD-10-CM | POA: Diagnosis not present

## 2015-07-25 NOTE — Progress Notes (Signed)
Patient Care Team: Maisie Fus, MD as PCP - General (Obstetrics and Gynecology)  DIAGNOSIS: Breast cancer of lower-outer quadrant of left female breast [facility])   Staging form: Breast, AJCC 7th Edition     Clinical: Stage IIA (T2, N0, cM0) - Unsigned       Staging comments: Staged at breast conference 03/29/13      Pathologic: No stage assigned - Unsigned   SUMMARY OF ONCOLOGIC HISTORY:   Breast cancer of lower-outer quadrant of left female breast ([place])   03/16/2013 Initial Biopsy Invasive ductal carcinoma grade 1, ER positive, PR positive, HER-2 negative, Ki-67 11%   03/24/2013 Breast MRI Left breast bloody discharge mammogram mass 3.4 cm at 7:00, MRI revealed mass to be 5 cm   04/13/2013 Surgery Left mastectomy: invasive ductal carcinoma 4.2 cm, grade 2, intermediate grade DCIS, 3 SLN negative, ER 100%, PR 100%, HER-2 negative ratio, insufficient tissue for Oncotype DX testing: Followed by breast reconstruction by Dr. Migdalia Dk   05/25/2013 - 07/28/2013 Chemotherapy Adjuvant chemotherapy with Taxotere and Cytoxan every 21 days x4 cycles   08/21/2013 -  Anti-estrogen oral therapy Tamoxifen 20 mg daily plan is for 10 years    CHIEF COMPLIANT: follow-up of left breast cancer on tamoxifen  INTERVAL HISTORY: Amanda Coleman is a 49 year old with above-mentioned history of left breast cancer treated with mastectomy followed by adjuvant chemotherapy and tamoxifen. She is on it for the last 2 years. She has some hot flashes and bone pain. Yoga appears to be helping some of the muscle and bone pain.  Lumps or nodules in the breasts.  REVIEW OF SYSTEMS:   Constitutional: Denies fevers, chills or abnormal weight loss Eyes: Denies blurriness of vision Ears, nose, mouth, throat, and face: Denies mucositis or sore throat Respiratory: Denies cough, dyspnea or wheezes Cardiovascular: Denies palpitation, chest discomfort or lower extremity swelling Gastrointestinal:  Denies nausea, heartburn or change in bowel  habits Skin: Denies abnormal skin rashes Lymphatics: Denies new lymphadenopathy or easy bruising Neurological:Denies numbness, tingling or new weaknesses Behavioral/Psych: Mood is stable, no new changes  Breast:  denies any pain or lumps or nodules in either breasts, occasional discomfort in the` left breast implant All other systems were reviewed with the patient and are negative.  I have reviewed the past medical history, past surgical history, social history and family history with the patient and they are unchanged from previous note.  ALLERGIES:  is allergic to penicillins; sulfa antibiotics; and sulfamethoxazole.  MEDICATIONS:  Current Outpatient Prescriptions  Medication Sig Dispense Refill  . ALPRAZolam (XANAX) 0.5 MG tablet Take 0.5 mg by mouth as needed for sleep.    Marland Kitchen levothyroxine (SYNTHROID, LEVOTHROID) 137 MCG tablet     . Multiple Vitamins-Minerals (MULTIVITAMIN WITH MINERALS) tablet Take 1 tablet by mouth daily.    . tamoxifen (NOLVADEX) 20 MG tablet Take 1 tablet (20 mg total) by mouth daily. 30 tablet 12  . venlafaxine XR (EFFEXOR XR) 37.5 MG 24 hr capsule Take 1 capsule (37.5 mg total) by mouth at bedtime. 30 capsule 12  . zolpidem (AMBIEN) 10 MG tablet Take 10 mg by mouth at bedtime as needed for sleep.     No current facility-administered medications for this visit.    PHYSICAL EXAMINATION: ECOG PERFORMANCE STATUS: 1 - Symptomatic but completely ambulatory  Filed Vitals:   07/25/15 1037 07/25/15 1041  BP: 135/72 113/60  Pulse: 72 82  Temp: 98.2 F (36.8 C) 98 F (36.7 C)  Resp: 18 18   Filed  Weights   07/25/15 1037 07/25/15 1041  Weight: 152 lb 6.4 oz (69.128 kg) 159 lb (72.122 kg)    GENERAL:alert, no distress and comfortable SKIN: skin color, texture, turgor are normal, no rashes or significant lesions EYES: normal, Conjunctiva are pink and non-injected, sclera clear OROPHARYNX:no exudate, no erythema and lips, buccal mucosa, and tongue normal    NECK: supple, thyroid normal size, non-tender, without nodularity LYMPH:  no palpable lymphadenopathy in the cervical, axillary or inguinal LUNGS: clear to auscultation and percussion with normal breathing effort HEART: regular rate & rhythm and no murmurs and no lower extremity edema ABDOMEN:abdomen soft, non-tender and normal bowel sounds Musculoskeletal:no cyanosis of digits and no clubbing  NEURO: alert & oriented x 3 with fluent speech, no focal motor/sensory deficits BREAST: No palpable masses or nodules in either right or left breasts. No palpable axillary supraclavicular or infraclavicular adenopathy no breast tenderness or nipple discharge.left breast reconstructed slightly sensitive to palpation but not tender. (exam performed in the presence of a chaperone)  LABORATORY DATA:  I have reviewed the data as listed   Chemistry      Component Value Date/Time   NA 142 07/23/2014 0934   NA 137 06/23/2013 1522   K 4.2 07/23/2014 0934   K 4.2 06/23/2013 1522   CL 102 06/23/2013 1522   CO2 28 07/23/2014 0934   CO2 28 06/23/2013 1522   BUN 14.8 07/23/2014 0934   BUN 11 06/23/2013 1522   CREATININE 0.7 07/23/2014 0934   CREATININE 0.70 06/23/2013 1522      Component Value Date/Time   CALCIUM 9.4 07/23/2014 0934   CALCIUM 8.9 06/23/2013 1522   ALKPHOS 67 07/23/2014 0934   ALKPHOS 110 06/23/2013 1522   AST 27 07/23/2014 0934   AST 34 06/23/2013 1522   ALT 40 07/23/2014 0934   ALT 23 06/23/2013 1522   BILITOT 0.31 07/23/2014 0934   BILITOT 0.1* 06/23/2013 1522       Lab Results  Component Value Date   WBC 4.9 07/23/2014   HGB 12.9 07/23/2014   HCT 39.9 07/23/2014   MCV 90.9 07/23/2014   PLT 218 07/23/2014   NEUTROABS 3.3 07/23/2014   ASSESSMENT & PLAN:  Breast cancer of lower-outer quadrant of left female breast ([facility]) 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 Status post mastectomy followed by systemic chemotherapy and is currently on tamoxifen that started 08/21/2013 the plan of treatment 10 years  Side effects of tamoxifen: 1. Hot flashes: Currently on Effexor. 2. Occasional bone pain at night: improved with magnesium supplementation to help with the bone pain. 3. Occasional nausea:  4. Difficulty with sleep: using guided imagery for stress relaxation  Surveillance: I reviewed the mammogram done 03/26/15 on the right side which was normal.  Today's breast exam was also normal.  Survivorship: I discussed importance of going back to doing yoga and exercise.she is eating very healthy and takes good care of her diet. She uses turmeric in her foods  We discussed the role of bowel cleansers and at this point there is no data to support them in breast cancer prevention. We docked about importance of fruits and vegetables in diet. Return to clinic in 6 months. We will see her every 6 months for one more year and then annually thereafter  No orders of the defined types were placed in this encounter.  The patient has a good understanding of the overall plan. she agrees with it. she will call with any problems that may develop before the next visit here.   Rulon Eisenmenger, MD 07/25/2015

## 2015-07-25 NOTE — Telephone Encounter (Signed)
Appointments made and avs printed for patient °

## 2015-07-25 NOTE — Addendum Note (Signed)
Addended by: Prentiss Bells on: 07/25/2015 07:44 PM   Modules accepted: Medications

## 2015-07-26 ENCOUNTER — Telehealth: Payer: Self-pay | Admitting: Hematology and Oncology

## 2015-07-26 NOTE — Telephone Encounter (Signed)
Left a message to call for survivorship if she likes

## 2015-07-30 ENCOUNTER — Telehealth: Payer: Self-pay | Admitting: Hematology and Oncology

## 2015-07-30 NOTE — Telephone Encounter (Signed)
Patient called back about survivorship and may decide to schedule later

## 2015-09-17 ENCOUNTER — Telehealth: Payer: Self-pay | Admitting: Hematology and Oncology

## 2015-09-17 NOTE — Telephone Encounter (Signed)
Returned her call to schedule survivorship

## 2015-09-19 ENCOUNTER — Telehealth: Payer: Self-pay | Admitting: Hematology and Oncology

## 2015-09-19 NOTE — Telephone Encounter (Signed)
Patient called in and has rescheduled her survivorship

## 2015-09-23 ENCOUNTER — Telehealth: Payer: Self-pay | Admitting: Nurse Practitioner

## 2015-09-23 NOTE — Telephone Encounter (Signed)
Called and spoke to patient who had been scheduled for survivorship care plan visit.  She completed active treatment in 07/2013; SCP visit not appropriate at this time and too early for referral for LTS visit.  Called and explained to patient; stressed that she was still very much considered a survivor and that Dr. Lindi Adie would continue to provide her care and follow up until a time in the future when she would be transitioned to the Survivorship clinic. Pt considered enrolling in upcoming "Healthy Eating" class, which this provider very much encouraged.  Also encouraged pt to call with any questions / concerns.

## 2015-09-24 ENCOUNTER — Encounter: Payer: BLUE CROSS/BLUE SHIELD | Admitting: Nurse Practitioner

## 2015-10-07 ENCOUNTER — Other Ambulatory Visit: Payer: Self-pay | Admitting: *Deleted

## 2015-10-07 DIAGNOSIS — C50919 Malignant neoplasm of unspecified site of unspecified female breast: Secondary | ICD-10-CM

## 2015-10-07 MED ORDER — TAMOXIFEN CITRATE 20 MG PO TABS
20.0000 mg | ORAL_TABLET | Freq: Every day | ORAL | Status: DC
Start: 2015-10-07 — End: 2016-10-05

## 2015-10-07 MED ORDER — VENLAFAXINE HCL ER 37.5 MG PO CP24: 37.5000 mg | ORAL_CAPSULE | Freq: Every day | ORAL | Status: DC

## 2015-10-08 ENCOUNTER — Encounter: Payer: BLUE CROSS/BLUE SHIELD | Admitting: Nurse Practitioner

## 2016-01-21 ENCOUNTER — Encounter: Payer: Self-pay | Admitting: Hematology and Oncology

## 2016-01-21 ENCOUNTER — Telehealth: Payer: Self-pay | Admitting: Hematology and Oncology

## 2016-01-21 ENCOUNTER — Ambulatory Visit (HOSPITAL_BASED_OUTPATIENT_CLINIC_OR_DEPARTMENT_OTHER): Payer: BLUE CROSS/BLUE SHIELD | Admitting: Hematology and Oncology

## 2016-01-21 VITALS — BP 126/79 | HR 70 | Temp 97.9°F | Resp 18 | Ht 64.0 in | Wt 152.1 lb

## 2016-01-21 DIAGNOSIS — C50512 Malignant neoplasm of lower-outer quadrant of left female breast: Secondary | ICD-10-CM

## 2016-01-21 DIAGNOSIS — Z79810 Long term (current) use of selective estrogen receptor modulators (SERMs): Secondary | ICD-10-CM

## 2016-01-21 DIAGNOSIS — Z170 Estrogen receptor positive status [ER+]: Secondary | ICD-10-CM

## 2016-01-21 MED ORDER — MICONAZOLE NITRATE 2 % EX CREA: 1.0000 "application " | TOPICAL_CREAM | Freq: Two times a day (BID) | CUTANEOUS | Status: DC

## 2016-01-21 NOTE — Assessment & Plan Note (Signed)
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 Status post mastectomy followed by systemic chemotherapy and is currently on tamoxifen that started 08/21/2013 the plan of treatment 10 years  Side effects of tamoxifen: 1. Hot flashes: Currently on Effexor. 2. Occasional bone pain at night: improved with magnesium supplementation to help with the bone pain. 3. Occasional nausea:  4. Difficulty with sleep: using guided imagery for stress relaxation  Breast Cancer Surveillance: 1. Breast exam 01/21/2016: Normal 2. Mammogram 03/26/2015 No abnormalities. Postsurgical changes. Breast Density Category C. I recommended that Amanda Coleman get 3-D mammograms for surveillance. Discussed the differences between different breast density categories.   Return to clinic in 1 year for follow-up

## 2016-01-21 NOTE — Progress Notes (Signed)
Patient Care Team: Amanda Fus, MD as PCP - General (Obstetrics and Gynecology)  DIAGNOSIS: Breast cancer of lower-outer quadrant of left female breast [facility])   Staging form: Breast, AJCC 7th Edition     Clinical: Stage IIA (T2, N0, cM0) - Unsigned       Staging comments: Staged at breast conference 03/29/13      Pathologic: No stage assigned - Unsigned  SUMMARY OF ONCOLOGIC HISTORY:   Breast cancer of lower-outer quadrant of left female breast ([facility])   03/16/2013 Initial Biopsy Invasive ductal carcinoma grade 1, ER positive, PR positive, HER-2 negative, Ki-67 11%   03/24/2013 Breast MRI Left breast bloody discharge mammogram mass 3.4 cm at 7:00, MRI revealed mass to be 5 cm   04/13/2013 Surgery Left mastectomy: invasive ductal carcinoma 4.2 cm, grade 2, intermediate grade DCIS, 3 SLN negative, ER 100%, PR 100%, HER-2 negative ratio, insufficient tissue for Oncotype DX testing: Followed by breast reconstruction by Dr. Migdalia Coleman   05/25/2013 - 07/28/2013 Chemotherapy Adjuvant chemotherapy with Taxotere and Cytoxan every 21 days x4 cycles   08/21/2013 -  Anti-estrogen oral therapy Tamoxifen 20 mg daily plan is for 10 years    CHIEF COMPLIANT: Follow-up on tamoxifen therapy, continues to have hot flashes  INTERVAL HISTORY: Amanda Coleman is a 50 year old with above-mentioned history of left breast cancer currently on adjuvant antiestrogen therapy with tamoxifen. She continues to have hot flashes for which she takes Effexor. The bone and muscle aches have improved. Is to have difficulty with sleep. Her husband was recently diagnosed with colon cancer and is undergoing evaluation. She is very worried about him. She hopes that he would have early stage disease.  REVIEW OF SYSTEMS:   Constitutional: Denies fevers, chills or abnormal weight loss Eyes: Denies blurriness of vision Ears, nose, mouth, throat, and face: Denies mucositis or sore throat Respiratory: Denies cough, dyspnea or  wheezes Cardiovascular: Denies palpitation, chest discomfort Gastrointestinal:  Denies nausea, heartburn or change in bowel habits Skin: Denies abnormal skin rashes Lymphatics: Denies new lymphadenopathy or easy bruising Neurological:Denies numbness, tingling or new weaknesses Behavioral/Psych: Mood is stable, no new changes  Extremities: No lower extremity edema Breast:  denies any pain or lumps or nodules in either breasts All other systems were reviewed with the patient and are negative.  I have reviewed the past medical history, past surgical history, social history and family history with the patient and they are unchanged from previous note.  ALLERGIES:  is allergic to penicillins; sulfa antibiotics; and sulfamethoxazole.  MEDICATIONS:  Current Outpatient Prescriptions  Medication Sig Dispense Refill  . ALPRAZolam (XANAX) 0.5 MG tablet Take 0.5 mg by mouth as needed for sleep.    Marland Kitchen levothyroxine (SYNTHROID, LEVOTHROID) 137 MCG tablet     . Multiple Vitamins-Minerals (MULTIVITAMIN WITH MINERALS) tablet Take 1 tablet by mouth daily.    . tamoxifen (NOLVADEX) 20 MG tablet Take 1 tablet (20 mg total) by mouth daily. 30 tablet 12  . venlafaxine XR (EFFEXOR XR) 37.5 MG 24 hr capsule Take 1 capsule (37.5 mg total) by mouth at bedtime. 30 capsule 12  . zolpidem (AMBIEN) 10 MG tablet Take 10 mg by mouth at bedtime as needed for sleep.     No current facility-administered medications for this visit.    PHYSICAL EXAMINATION: ECOG PERFORMANCE STATUS: 1 - Symptomatic but completely ambulatory  Filed Vitals:   01/21/16 1012  BP: 126/79  Pulse: 70  Temp: 97.9 F (36.6 C)  Resp: 18   Filed Weights  01/21/16 1012  Weight: 152 lb 1.6 oz (68.992 kg)    GENERAL:alert, no distress and comfortable SKIN: skin color, texture, turgor are normal, no rashes or significant lesions EYES: normal, Conjunctiva are pink and non-injected, sclera clear OROPHARYNX:no exudate, no erythema and lips,  buccal mucosa, and tongue normal  NECK: supple, thyroid normal size, non-tender, without nodularity LYMPH:  no palpable lymphadenopathy in the cervical, axillary or inguinal LUNGS: clear to auscultation and percussion with normal breathing effort HEART: regular rate & rhythm and no murmurs and no lower extremity edema ABDOMEN:abdomen soft, non-tender and normal bowel sounds MUSCULOSKELETAL:no cyanosis of digits and no clubbing  NEURO: alert & oriented x 3 with fluent speech, no focal motor/sensory deficits EXTREMITIES: No lower extremity edema BREAST:Care is enlarging mass in the above is significantly we did a bone scan and patient is in the formulation that originally this was 4 mm now measures 2.5 by his report in the anterior nose anterior superior margin is me No palpable masses or nodules in either right or left breasts. No palpable axillary supraclavicular or infraclavicular adenopathy no breast tenderness or nipple discharge. (exam performed in the presence of a chaperone)  LABORATORY DATA:  I have reviewed the data as listed   Chemistry      Component Value Date/Time   NA 142 07/23/2014 0934   NA 137 06/23/2013 1522   K 4.2 07/23/2014 0934   K 4.2 06/23/2013 1522   CL 102 06/23/2013 1522   CO2 28 07/23/2014 0934   CO2 28 06/23/2013 1522   BUN 14.8 07/23/2014 0934   BUN 11 06/23/2013 1522   CREATININE 0.7 07/23/2014 0934   CREATININE 0.70 06/23/2013 1522      Component Value Date/Time   CALCIUM 9.4 07/23/2014 0934   CALCIUM 8.9 06/23/2013 1522   ALKPHOS 67 07/23/2014 0934   ALKPHOS 110 06/23/2013 1522   AST 27 07/23/2014 0934   AST 34 06/23/2013 1522   ALT 40 07/23/2014 0934   ALT 23 06/23/2013 1522   BILITOT 0.31 07/23/2014 0934   BILITOT 0.1* 06/23/2013 1522       Lab Results  Component Value Date   WBC 4.9 07/23/2014   HGB 12.9 07/23/2014   HCT 39.9 07/23/2014   MCV 90.9 07/23/2014   PLT 218 07/23/2014   NEUTROABS 3.3 07/23/2014     ASSESSMENT & PLAN:   Breast cancer of lower-outer quadrant of left female breast ([facility]) 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 Status post mastectomy followed by systemic chemotherapy and is currently on tamoxifen that started 08/21/2013 the plan of treatment 5-10 years  Side effects of tamoxifen: 1. Hot flashes: Currently on Effexor. 2. Occasional bone pain at night: improved with magnesium supplementation to help with the bone pain. 3. Occasional nausea:  4. Difficulty with sleep: using guided imagery for stress relaxation  Breast Cancer Surveillance: 1. Breast exam 01/21/2016: Normal 2. Mammogram 03/26/2015 No abnormalities. Postsurgical changes. Breast Density Category C. I recommended that she get 3-D mammograms for surveillance. Discussed the differences between different breast density categories.  Her husband is undergoing surgery for colon cancer. She is very anxious and nervous about it. Return to clinic in 1 year for follow-up  No orders of the defined types were placed in this encounter.   The patient has a good understanding of the overall plan. she agrees with it.  she will call with any problems that may develop before the next visit here.   Rulon Eisenmenger, MD 01/21/2016

## 2016-01-21 NOTE — Telephone Encounter (Signed)
appt made and avs printed °

## 2016-03-09 ENCOUNTER — Other Ambulatory Visit: Payer: Self-pay | Admitting: Hematology and Oncology

## 2016-03-09 DIAGNOSIS — Z9012 Acquired absence of left breast and nipple: Secondary | ICD-10-CM

## 2016-03-09 DIAGNOSIS — Z1231 Encounter for screening mammogram for malignant neoplasm of breast: Secondary | ICD-10-CM

## 2016-03-26 ENCOUNTER — Ambulatory Visit
Admission: RE | Admit: 2016-03-26 | Discharge: 2016-03-26 | Disposition: A | Discharge status: Home / Self Care | Payer: BLUE CROSS/BLUE SHIELD | Source: Ambulatory Visit | Attending: Hematology and Oncology | Admitting: Hematology and Oncology

## 2016-03-26 DIAGNOSIS — Z9012 Acquired absence of left breast and nipple: Secondary | ICD-10-CM

## 2016-03-26 DIAGNOSIS — Z1231 Encounter for screening mammogram for malignant neoplasm of breast: Secondary | ICD-10-CM

## 2016-10-05 ENCOUNTER — Other Ambulatory Visit: Payer: Self-pay

## 2016-10-05 MED ORDER — TAMOXIFEN CITRATE 20 MG PO TABS: 20.0000 mg | ORAL_TABLET | Freq: Every day | ORAL | 1 refills | Status: DC

## 2017-01-19 NOTE — Assessment & Plan Note (Addendum)
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 Status post mastectomy followed by systemic chemotherapy and is currently on tamoxifen that started 08/21/2013 the plan of treatment 5-10 years (will request [facility] testing today)  Side effects of tamoxifen: 1. Hot flashes: Currently on Effexor, dose increased today. 2. Occasional bone pain at night: improved with magnesium supplementation to help with the bone pain. 3. Occasional nausea:  4. Difficulty with sleep: using guided imagery for stress relaxation  Breast Cancer Surveillance: 1. Breast exam 01/20/2017: Normal 2. Mammogram 08/26/16 No abnormalities. Postsurgical changes. Breast Density Category C. I recommended that she get 3-D mammograms for surveillance. Discussed the differences between different breast density categories.  I reviewed [employer] testing with her in detail and gave her a handout of information in clinic.  I counseled her on this in detail.  I recommended healthy diet and exercise for weight loss.     Return to clinic in 1 year for follow-up

## 2017-01-20 ENCOUNTER — Encounter: Payer: Self-pay | Admitting: Adult Health

## 2017-01-20 ENCOUNTER — Ambulatory Visit (HOSPITAL_BASED_OUTPATIENT_CLINIC_OR_DEPARTMENT_OTHER): Payer: PRIVATE HEALTH INSURANCE | Admitting: Adult Health

## 2017-01-20 VITALS — BP 147/86 | HR 67 | Temp 97.7°F | Resp 18 | Ht 64.0 in | Wt 161.7 lb

## 2017-01-20 DIAGNOSIS — C50512 Malignant neoplasm of lower-outer quadrant of left female breast: Secondary | ICD-10-CM | POA: Diagnosis not present

## 2017-01-20 DIAGNOSIS — Z1239 Encounter for other screening for malignant neoplasm of breast: Secondary | ICD-10-CM

## 2017-01-20 DIAGNOSIS — Z170 Estrogen receptor positive status [ER+]: Secondary | ICD-10-CM | POA: Diagnosis not present

## 2017-01-20 DIAGNOSIS — Z79810 Long term (current) use of selective estrogen receptor modulators (SERMs): Secondary | ICD-10-CM | POA: Diagnosis not present

## 2017-01-20 MED ORDER — VENLAFAXINE HCL ER 37.5 MG PO CP24: 37.5000 mg | ORAL_CAPSULE | Freq: Two times a day (BID) | ORAL | 12 refills | Status: DC

## 2017-01-20 MED ORDER — TAMOXIFEN CITRATE 20 MG PO TABS: 20.0000 mg | ORAL_TABLET | Freq: Every day | ORAL | 3 refills | Status: DC

## 2017-01-20 NOTE — Progress Notes (Signed)
[place] [facility]:    Amanda Fus, MD [address] [place] [address]   DIAGNOSIS: Cancer Staging Breast cancer of lower-outer quadrant of left female breast [facility]) Staging form: Breast, AJCC 7th Edition - Clinical: Stage IIA (T2, N0, cM0) - Unsigned Staging comments: Staged at breast conference 03/29/13  - Pathologic: No stage assigned - Unsigned   SUMMARY OF ONCOLOGIC HISTORY:   Breast cancer of lower-outer quadrant of left female breast ([place])   03/16/2013 Initial Biopsy    Invasive ductal carcinoma grade 1, ER positive, PR positive, HER-2 negative, Ki-67 11%      03/24/2013 Breast MRI    Left breast bloody discharge mammogram mass 3.4 cm at 7:00, MRI revealed mass to be 5 cm      04/13/2013 Surgery    Left mastectomy: invasive ductal carcinoma 4.2 cm, grade 2, intermediate grade DCIS, 3 SLN negative, ER 100%, PR 100%, HER-2 negative ratio, insufficient tissue for Oncotype DX testing: Followed by breast reconstruction by Dr. Migdalia Dk      05/25/2013 - 07/28/2013 Chemotherapy    Adjuvant chemotherapy with Taxotere and Cytoxan every 21 days x4 cycles      08/21/2013 -  Anti-estrogen oral therapy    Tamoxifen 20 mg daily plan is for 10 years       CURRENT THERAPY: Tamoxifen daily  INTERVAL HISTORY: Amanda Coleman 51 y.o. female returns for evaluation of her breast cancer.  She continues on Tamoxifen daily and is having some hot flashes.  She has had some difficulty with losing weight that she put on with her breast cancer treatment.  She is not exercising the way that she should.  She also notes some swelling in her hands bilaterally.  She is still having pelvic exams and seeing gynecology annually.    Patient Active Problem List   Diagnosis Date Noted  . Open breast wound 08/24/2013  . Acquired absence of breast 05/02/2013  . Breast cancer of lower-outer quadrant of left female breast ([place]) 03/21/2013    is allergic to  penicillins; sulfa antibiotics; and sulfamethoxazole.  MEDICAL HISTORY: Past Medical History:  Diagnosis Date  . Breast cancer ([place])   . GERD (gastroesophageal reflux disease)   . Hashimoto's disease   . Hypothyroidism   . PONV (postoperative nausea and vomiting)   . Thyroid disease     SURGICAL HISTORY: Past Surgical History:  Procedure Laterality Date  . ABDOMINAL HYSTERECTOMY    . BREAST RECONSTRUCTION WITH PLACEMENT OF TISSUE EXPANDER AND FLEX HD (ACELLULAR HYDRATED DERMIS) Left 04/13/2013   Procedure: LEFT BREAST RECONSTRUCTION WITH PLACEMENT OF TISSUE EXPANDER AND FLEX HD (ACELLULAR HYDRATED DERMIS);  Surgeon: [name], DO;  Location: [place];  Service: Plastics;  Laterality: Left;  . gum graft    . MASS EXCISION Left 08/24/2013   Procedure: EXCISION OF WOUND LEFT BREAST WITH CLOSURE ;  Surgeon: [name], DO;  Location: [facility];  Service: Plastics;  Laterality: Left;  Marland Kitchen MASTECTOMY W/ SENTINEL NODE BIOPSY Left 04/13/2013   Procedure: LEFT SKIN SPARING MASTECTOMY WITH LEFT AXILLARY SENTINEL NODE BIOPSY;  Surgeon: [name], MD;  Location: [facility];  Service: General;  Laterality: Left;  . PORTACATH PLACEMENT N/A 05/18/2013   Procedure: INSERTION PORT-A-CATH;  Surgeon: [name], MD;  Location: [facility];  Service: General;  Laterality: N/A;  . REMOVAL OF TISSUE EXPANDER AND PLACEMENT OF IMPLANT Left 11/23/2013   Procedure: REMOVAL OF LEFT  TISSUE EXPANDER WITH  PLACEMENT OF IMPLANT TO LEFT BREAST,AND REMOVAL OF PORT ON RIGHT;  Surgeon: [name], DO;  Location: [facility];  Service: Plastics;  Laterality: Left;  . SIMPLE MASTECTOMY WITH AXILLARY SENTINEL NODE BIOPSY Left 04/13/2013   [name]    SOCIAL HISTORY: Social History   Social History  . Marital status: Married    Spouse name: N/A  . Number of children: 0  . Years of education: N/A   Occupational History  . Not on file.   Social History Main Topics  . Smoking  status: Never Smoker  . Smokeless tobacco: Never Used  . Alcohol use No  . Drug use: No  . Sexual activity: Yes    Birth control/ protection: Surgical     Comment: Hysterectomy   Other Topics Concern  . Not on file   Social History Narrative  . No narrative on file    FAMILY HISTORY: Family History  Problem Relation Age of Onset  . Pancreatic cancer Father 52  . Lung cancer Maternal Aunt        smoker  . Uterine cancer Maternal Grandmother        dx in her 36s  . Ovarian cancer Maternal Grandmother        dx in her 56s    Review of Systems  Constitutional: Negative for appetite change, chills, diaphoresis, fatigue, fever and unexpected weight change.  HENT:   Negative for hearing loss and lump/mass.   Eyes: Negative for eye problems and icterus.  Respiratory: Negative for chest tightness, cough and shortness of breath.   Cardiovascular: Negative for chest pain, leg swelling and palpitations.  Gastrointestinal: Negative for abdominal distention and abdominal pain.  Endocrine: Negative for hot flashes.  Genitourinary: Negative for difficulty urinating and dyspareunia.   Musculoskeletal: Negative for arthralgias.  Skin: Negative for itching and rash.  Neurological: Negative for dizziness, extremity weakness and headaches.  Hematological: Negative for adenopathy. Does not bruise/bleed easily.  Psychiatric/Behavioral: Negative for depression.      PHYSICAL EXAMINATION  ECOG PERFORMANCE STATUS: 1 - Symptomatic but completely ambulatory  Vitals:   01/20/17 1055  BP: (!) 147/86  Pulse: 67  Resp: 18  Temp: 97.7 F (36.5 C)    Physical Exam  Constitutional: She is oriented to person, place, and time and well-developed, well-nourished, and in no distress.  HENT:  Head: Normocephalic and atraumatic.  Mouth/Throat: Oropharynx is clear and moist. No oropharyngeal exudate.  Eyes: Pupils are equal, round, and reactive to light. No scleral icterus.  Neck: Neck supple.    Cardiovascular: Normal rate, regular rhythm and normal heart sounds.   Pulmonary/Chest: Effort normal and breath sounds normal. No respiratory distress.  Left breast s/p mastectomy and reconstruction with implant placement, no swelling, nodules, skin changes noted, right breast without nodules, masses, skin or nipple changes, benign bilateral breast exam   Abdominal: Soft. Bowel sounds are normal. She exhibits no distension. There is no tenderness.  Musculoskeletal: She exhibits no edema.  Lymphadenopathy:    She has no cervical adenopathy.  Neurological: She is alert and oriented to person, place, and time.  Skin: Skin is warm and dry. No rash noted.  Psychiatric: Mood and affect normal.    LABORATORY DATA:  CBC    Component Value Date/Time   WBC 4.9 07/23/2014 0934   WBC 5.8 05/11/2013 0900   RBC 4.39 07/23/2014 0934   RBC 3.72 (L) 05/11/2013 0900   HGB 12.9 07/23/2014 0934   HCT 39.9 07/23/2014 0934  PLT 218 07/23/2014 0934   MCV 90.9 07/23/2014 0934   MCH 29.5 07/23/2014 0934   MCH 29.3 05/11/2013 0900   MCHC 32.4 07/23/2014 0934   MCHC 32.3 05/11/2013 0900   RDW 12.6 07/23/2014 0934   LYMPHSABS 1.2 07/23/2014 0934   MONOABS 0.2 07/23/2014 0934   EOSABS 0.1 07/23/2014 0934   BASOSABS 0.0 07/23/2014 0934    CMP     Component Value Date/Time   NA 142 07/23/2014 0934   K 4.2 07/23/2014 0934   CL 102 06/23/2013 1522   CO2 28 07/23/2014 0934   GLUCOSE 89 07/23/2014 0934   BUN 14.8 07/23/2014 0934   CREATININE 0.7 07/23/2014 0934   CALCIUM 9.4 07/23/2014 0934   PROT 6.5 07/23/2014 0934   ALBUMIN 3.9 07/23/2014 0934   AST 27 07/23/2014 0934   ALT 40 07/23/2014 0934   ALKPHOS 67 07/23/2014 0934   BILITOT 0.31 07/23/2014 0934   GFRNONAA >90 04/07/2013 1058   GFRAA >90 04/07/2013 1058       RADIOGRAPHIC STUDIES:         ASSESSMENT and PLAN:   Breast cancer of lower-outer quadrant of left female breast ([place]) Breast cancer of lower-outer quadrant of  left female breast ([place]) 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 Status post mastectomy followed by systemic chemotherapy and is currently on tamoxifen that started 08/21/2013 the plan of treatment 5-10 years (will request BCI testing today)  Side effects of tamoxifen: 1. Hot flashes: Currently on Effexor, dose increased today. 2. Occasional bone pain at night: improved with magnesium supplementation to help with the bone pain. 3. Occasional nausea:  4. Difficulty with sleep: using guided imagery for stress relaxation  Breast Cancer Surveillance: 1. Breast exam 01/20/2017: Normal 2. Mammogram 08/26/16 No abnormalities. Postsurgical changes. Breast Density Category C. I recommended that she get 3-D mammograms for surveillance. Discussed the differences between different breast density categories.  I reviewed BCI testing with her in detail and gave her a handout of information in clinic.  I counseled her on this in detail.  I recommended healthy diet and exercise for weight loss.     Return to clinic in 1 year for follow-up    All questions were answered. The patient knows to call the clinic with any problems, questions or concerns. We can certainly see the patient much sooner if necessary.  A total of (30) minutes of face-to-face time was spent with this patient with greater than 50% of that time in counseling and care-coordination.  This note was electronically signed. Scot Dock, NP 01/20/2017

## 2017-02-04 ENCOUNTER — Encounter: Payer: Self-pay | Admitting: *Deleted

## 2017-02-09 ENCOUNTER — Other Ambulatory Visit: Payer: Self-pay | Admitting: Hematology and Oncology

## 2017-02-09 DIAGNOSIS — Z1231 Encounter for screening mammogram for malignant neoplasm of breast: Secondary | ICD-10-CM

## 2017-02-22 ENCOUNTER — Other Ambulatory Visit: Payer: Self-pay

## 2017-02-22 DIAGNOSIS — C50512 Malignant neoplasm of lower-outer quadrant of left female breast: Secondary | ICD-10-CM

## 2017-02-22 DIAGNOSIS — Z170 Estrogen receptor positive status [ER+]: Principal | ICD-10-CM

## 2017-02-22 MED ORDER — TAMOXIFEN CITRATE 20 MG PO TABS: 20.0000 mg | ORAL_TABLET | Freq: Every day | ORAL | 3 refills | Status: DC

## 2017-03-30 ENCOUNTER — Ambulatory Visit
Admission: RE | Admit: 2017-03-30 | Discharge: 2017-03-30 | Disposition: A | Discharge status: Home / Self Care | Payer: PRIVATE HEALTH INSURANCE | Source: Ambulatory Visit | Attending: Hematology and Oncology | Admitting: Hematology and Oncology

## 2017-03-30 DIAGNOSIS — Z1231 Encounter for screening mammogram for malignant neoplasm of breast: Secondary | ICD-10-CM

## 2017-03-30 HISTORY — DX: Personal history of antineoplastic chemotherapy: Z92.21

## 2017-11-10 ENCOUNTER — Other Ambulatory Visit: Payer: Self-pay | Admitting: Hematology and Oncology

## 2017-11-10 DIAGNOSIS — Z139 Encounter for screening, unspecified: Secondary | ICD-10-CM

## 2018-01-20 ENCOUNTER — Inpatient Hospital Stay: Payer: Self-pay | Attending: Hematology and Oncology | Admitting: Hematology and Oncology

## 2018-01-20 ENCOUNTER — Inpatient Hospital Stay: Payer: PRIVATE HEALTH INSURANCE

## 2018-01-20 DIAGNOSIS — Z79810 Long term (current) use of selective estrogen receptor modulators (SERMs): Secondary | ICD-10-CM | POA: Insufficient documentation

## 2018-01-20 DIAGNOSIS — C50512 Malignant neoplasm of lower-outer quadrant of left female breast: Secondary | ICD-10-CM | POA: Insufficient documentation

## 2018-01-20 DIAGNOSIS — Z79899 Other long term (current) drug therapy: Secondary | ICD-10-CM | POA: Insufficient documentation

## 2018-01-20 DIAGNOSIS — Z170 Estrogen receptor positive status [ER+]: Principal | ICD-10-CM

## 2018-01-20 DIAGNOSIS — M898X9 Other specified disorders of bone, unspecified site: Secondary | ICD-10-CM | POA: Insufficient documentation

## 2018-01-20 DIAGNOSIS — G478 Other sleep disorders: Secondary | ICD-10-CM | POA: Insufficient documentation

## 2018-01-20 DIAGNOSIS — Z9012 Acquired absence of left breast and nipple: Secondary | ICD-10-CM | POA: Insufficient documentation

## 2018-01-20 DIAGNOSIS — N951 Menopausal and female climacteric states: Secondary | ICD-10-CM | POA: Insufficient documentation

## 2018-01-20 DIAGNOSIS — Z9221 Personal history of antineoplastic chemotherapy: Secondary | ICD-10-CM | POA: Insufficient documentation

## 2018-01-20 NOTE — Assessment & Plan Note (Signed)
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 Status post mastectomy followed by systemic chemotherapy and is currently on tamoxifen that started 08/21/2013 the plan of treatment 5-10 years (will request BCI testing today)  Side effects of tamoxifen: 1. Hot flashes: Currently on Effexor, dose increased today. 2. Occasional bone pain at night: improved with magnesium supplementation to help with the bone pain. 3. Occasional nausea:  4. Difficulty with sleep: using guided imagery for stress relaxation  Breast Cancer Surveillance: 1. Breast exam 01/20/2018: Benign 2. Mammogram right breast 03/30/2017  No abnormalities. Postsurgical changes. Breast Density Category B.  Return to clinic in 1 year for follow-up

## 2018-01-20 NOTE — Progress Notes (Signed)
Patient Care Team: Maisie Fus, MD as PCP - General (Obstetrics and Gynecology)  DIAGNOSIS:  Encounter Diagnosis  Name Primary?  . Malignant neoplasm of lower-outer quadrant of left breast of female, estrogen receptor positive ([place])     SUMMARY OF ONCOLOGIC HISTORY:   Breast cancer of lower-outer quadrant of left female breast ([place])   03/16/2013 Initial Biopsy    Invasive ductal carcinoma grade 1, ER positive, PR positive, HER-2 negative, Ki-67 11%      03/24/2013 Breast MRI    Left breast bloody discharge mammogram mass 3.4 cm at 7:00, MRI revealed mass to be 5 cm      04/13/2013 Surgery    Left mastectomy: invasive ductal carcinoma 4.2 cm, grade 2, intermediate grade DCIS, 3 SLN negative, ER 100%, PR 100%, HER-2 negative ratio, insufficient tissue for Oncotype DX testing: Followed by breast reconstruction by Dr. Migdalia Dk      05/25/2013 - 07/28/2013 Chemotherapy    Adjuvant chemotherapy with Taxotere and Cytoxan every 21 days x4 cycles      08/21/2013 -  Anti-estrogen oral therapy    Tamoxifen 20 mg daily plan is for 10 years       CHIEF COMPLIANT: Follow-up on tamoxifen therapy  INTERVAL HISTORY: Amanda Coleman is a 52 year old with above-mentioned history of left breast cancer treated with left mastectomy adjuvant chemotherapy and is currently on tamoxifen.  She continues to have hot flashes for which she is on Effexor.  Denies any arthralgias or myalgias.  Denies any GYN problems.  She has difficulty with sleep.  REVIEW OF SYSTEMS:   Constitutional: Denies fevers, chills or abnormal weight loss Eyes: Denies blurriness of vision Ears, nose, mouth, throat, and face: Denies mucositis or sore throat Respiratory: Denies cough, dyspnea or wheezes Cardiovascular: Denies palpitation, chest discomfort Gastrointestinal:  Denies nausea, heartburn or change in bowel habits Skin: Denies abnormal skin rashes Lymphatics: Denies new lymphadenopathy or easy bruising Neurological:Denies  numbness, tingling or new weaknesses Behavioral/Psych: Mood is stable, no new changes  Extremities: No lower extremity edema Breast: Left mastectomy All other systems were reviewed with the patient and are negative.  I have reviewed the past medical history, past surgical history, social history and family history with the patient and they are unchanged from previous note.  ALLERGIES:  is allergic to penicillins; sulfa antibiotics; and sulfamethoxazole.  MEDICATIONS:  Current Outpatient Medications  Medication Sig Dispense Refill  . ALPRAZolam (XANAX) 0.5 MG tablet Take 0.5 mg by mouth as needed for sleep.    Marland Kitchen levothyroxine (SYNTHROID, LEVOTHROID) 137 MCG tablet     . miconazole (MICATIN) 2 % cream Apply 1 application topically 2 (two) times daily. 28.35 g 0  . Multiple Vitamins-Minerals (MULTIVITAMIN WITH MINERALS) tablet Take 1 tablet by mouth daily.    . tamoxifen (NOLVADEX) 20 MG tablet Take 1 tablet (20 mg total) by mouth daily. 90 tablet 3  . venlafaxine XR (EFFEXOR XR) 37.5 MG 24 hr capsule Take 1 capsule (37.5 mg total) by mouth 2 (two) times daily. 60 capsule 12  . zolpidem (AMBIEN) 10 MG tablet Take 10 mg by mouth at bedtime as needed for sleep.     No current facility-administered medications for this visit.     PHYSICAL EXAMINATION: ECOG PERFORMANCE STATUS: 1 - Symptomatic but completely ambulatory  Vitals:   01/20/18 1045  BP: (!) 148/68  Pulse: 67  Resp: 17  Temp: 97.7 F (36.5 C)  SpO2: 99%   Filed Weights   01/20/18 1045  Weight:  170 lb 1.6 oz (77.2 kg)    GENERAL:alert, no distress and comfortable SKIN: skin color, texture, turgor are normal, no rashes or significant lesions EYES: normal, Conjunctiva are pink and non-injected, sclera clear OROPHARYNX:no exudate, no erythema and lips, buccal mucosa, and tongue normal  NECK: supple, thyroid normal size, non-tender, without nodularity LYMPH:  no palpable lymphadenopathy in the cervical, axillary or  inguinal LUNGS: clear to auscultation and percussion with normal breathing effort HEART: regular rate & rhythm and no murmurs and no lower extremity edema ABDOMEN:abdomen soft, non-tender and normal bowel sounds MUSCULOSKELETAL:no cyanosis of digits and no clubbing  NEURO: alert & oriented x 3 with fluent speech, no focal motor/sensory deficits EXTREMITIES: No lower extremity edema BREAST: No palpable lumps or nodules in the right breast.  Left mastectomy (exam performed in the presence of a chaperone)  LABORATORY DATA:  I have reviewed the data as listed CMP Latest Ref Rng & Units 07/23/2014 01/25/2014 09/12/2013  Glucose 70 - 140 mg/dl 89 102 73  BUN 7.0 - 26.0 mg/dL 14.8 11.6 13.9  Creatinine 0.6 - 1.1 mg/dL 0.7 0.8 0.7  Sodium 136 - 145 mEq/L 142 145 144  Potassium 3.5 - 5.1 mEq/L 4.2 3.8 3.9  Chloride 96 - 112 mEq/L - - -  CO2 22 - 29 mEq/L '28 25 28  ' Calcium 8.4 - 10.4 mg/dL 9.4 9.2 9.9  Total Protein 6.4 - 8.3 g/dL 6.5 7.0 6.6  Total Bilirubin 0.20 - 1.20 mg/dL 0.31 0.61 0.38  Alkaline Phos 40 - 150 U/L 67 62 66  AST 5 - 34 U/L '27 20 16  ' ALT 0 - 55 U/L 40 18 14    Lab Results  Component Value Date   WBC 4.9 07/23/2014   HGB 12.9 07/23/2014   HCT 39.9 07/23/2014   MCV 90.9 07/23/2014   PLT 218 07/23/2014   NEUTROABS 3.3 07/23/2014    ASSESSMENT & PLAN:  Breast cancer of lower-outer quadrant of left female breast ([facility]) 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 Status post mastectomy followed by systemic chemotherapy and is currently on tamoxifen that started 08/21/2013 the plan of treatment 5-10 years (will request BCI testing today)  Side effects of tamoxifen: 1. Hot flashes: Currently on Effexor, dose increased today. 2. Occasional bone pain at night: improved with magnesium supplementation to help with the bone pain. 3. Occasional  nausea:  4. Difficulty with sleep: using guided imagery for stress relaxation  Breast Cancer Surveillance: 1. Breast exam 01/20/2018: Benign 2. Mammogram right breast 03/30/2017  No abnormalities. Postsurgical changes. Breast Density Category B.  Plan: We will check if she is in menopause by sending for [facility] We would like to assess the cost of breast cancer index because the patient is self-pay  If she is menopausal then we will switch her to letrozole. If she is not menopausal then we can continue with tamoxifen. Breast cancer index to help determine her needs to stay on it for extended adjuvant therapy.  Return to clinic in 1 year for follow-up      Orders Placed This Encounter  Procedures  . Follicle stimulating hormone    Standing Status:   Future    Number of Occurrences:   1    Standing Expiration Date:   02/24/2019   The patient has a good understanding of the overall plan. she agrees with  it. she will call with any problems that may develop before the next visit here.   Harriette Ohara, MD 01/20/18

## 2018-01-21 ENCOUNTER — Telehealth: Payer: Self-pay | Admitting: Hematology and Oncology

## 2018-01-21 LAB — FOLLICLE STIMULATING HORMONE: FSH: 40.1 m[IU]/mL

## 2018-01-21 MED ORDER — LETROZOLE 2.5 MG PO TABS: 2.5000 mg | ORAL_TABLET | Freq: Every day | ORAL | 3 refills | Status: DC

## 2018-01-21 NOTE — Telephone Encounter (Signed)
I informed the patient that her [facility] test came back as high which indicates that she is in menopause.  I called and informed her to stop tamoxifen and start taking letrozole.  I sent a new prescription to her pharmacy.

## 2018-02-09 ENCOUNTER — Other Ambulatory Visit: Payer: Self-pay | Admitting: Adult Health

## 2018-02-09 DIAGNOSIS — Z170 Estrogen receptor positive status [ER+]: Principal | ICD-10-CM

## 2018-02-09 DIAGNOSIS — C50512 Malignant neoplasm of lower-outer quadrant of left female breast: Secondary | ICD-10-CM

## 2018-03-23 ENCOUNTER — Ambulatory Visit
Admission: RE | Admit: 2018-03-23 | Discharge: 2018-03-23 | Disposition: A | Discharge status: Home / Self Care | Payer: Self-pay | Source: Ambulatory Visit | Attending: Hematology and Oncology | Admitting: Hematology and Oncology

## 2018-03-23 DIAGNOSIS — Z139 Encounter for screening, unspecified: Secondary | ICD-10-CM

## 2018-04-15 ENCOUNTER — Telehealth: Payer: Self-pay

## 2018-04-15 NOTE — Telephone Encounter (Signed)
Pt called to report about her mild ankle swelling, headaches and joint discomfort. She is wondering if this is related to her letrozole. Explained to pt that these side effects are expected, sometimes, with taking anti estrogens and encouraged pt to elevate her feet when at rest. Pt noticed more weight gain over the past few years and didn't know if that could be causing her joint pain. Suggested that pt start an daily activity routine or exercise routine like yoga, brisk walking, aerobics or swimming to help control joint aches/stiffness caused by AE therapy.   Pt reports new onset of headaches over the past few days on the back of her head. She denies any lumps, double vision, or dizziness. Pt had been under stress lately and noticed all of these symptoms recently. Suggested that pt go see her PCP for annual check up for BP. Pt scheduled for an appt already with labs next month.   Suggested that she stop letrozole if her symptoms persist in the next 2 weeks. She can stop letrozole for 1-2 weeks and if she improves, she can restart at night with 1/2 a pill for a few weeks. Also, she will need too keep herself hydrated and take OTC pain relievers for breakthrough pain if needed. Told pt to call and inform our office before making any changes so that Dr.Gudena will be aware of this.If her symptoms persist or gets worse with being off ae, she will need to make an appt with Dr.Gudena to see if she can be placed on an alternative medication therapy.  Pt verbalized understanding and is aware.

## 2019-01-02 ENCOUNTER — Other Ambulatory Visit: Payer: Self-pay | Admitting: Hematology and Oncology

## 2019-01-13 NOTE — Assessment & Plan Note (Deleted)
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 Status post mastectomy followed by systemic chemotherapy and is currently on tamoxifen that started 08/21/2013 switched to letrozole June 2019 when she was found to be menopausal.  Side effects of letrozole:  Breast Cancer Surveillance: 1. Breast exam 01/20/2018: Benign 2. Mammogram right breast 03/23/2018 benign. Postsurgical changes. Breast Density Category B.  FSH 40.1  Plan: We did not do breast cancer index because patient is self-pay. Our plan is to continue 5 more years of letrozole therapy.  Return to clinic in 1 year for follow-up 

## 2019-01-18 ENCOUNTER — Encounter: Payer: Self-pay | Admitting: *Deleted

## 2019-01-20 ENCOUNTER — Ambulatory Visit: Payer: Self-pay | Admitting: Hematology and Oncology

## 2019-01-23 ENCOUNTER — Telehealth: Payer: Self-pay | Admitting: Hematology and Oncology

## 2019-01-23 NOTE — Progress Notes (Signed)
HEMATOLOGY-ONCOLOGY DOXIMITY VISIT PROGRESS NOTE  I connected with Amanda Coleman on 01/24/2019 at 10:00 AM EDT by Doximity video conference and verified that I am speaking with the correct person using two identifiers.  I discussed the limitations, risks, security and privacy concerns of performing an evaluation and management service by Doximity and the availability of in person appointments.  I also discussed with the patient that there may be a patient responsible charge related to this service. The patient expressed understanding and agreed to proceed.  Patient's Location: Home Physician Location: Clinic  CHIEF COMPLIANT: Follow-up on tamoxifen therapy  INTERVAL HISTORY: Amanda Coleman is a 53 y.o. female with above-mentioned history of left breast cancer treated with left mastectomy, adjuvant chemotherapy, and is currently on anti-estrogen therapy with letrozole, after switching from tamoxifen when she entered menopause. I last saw her a year ago. Her most recent mammogram on 03/23/18 showed no evidence of malignancy in the right breast. She presents today over Doximity for annual follow-up.     Breast cancer of lower-outer quadrant of left female breast ([place])   03/16/2013 Initial Biopsy    Invasive ductal carcinoma grade 1, ER positive, PR positive, HER-2 negative, Ki-67 11%    03/24/2013 Breast MRI    Left breast bloody discharge mammogram mass 3.4 cm at 7:00, MRI revealed mass to be 5 cm    04/13/2013 Surgery    Left mastectomy: invasive ductal carcinoma 4.2 cm, grade 2, intermediate grade DCIS, 3 SLN negative, ER 100%, PR 100%, HER-2 negative ratio, insufficient tissue for Oncotype DX testing: Followed by breast reconstruction by Dr. Migdalia Dk    05/25/2013 - 07/28/2013 Chemotherapy    Adjuvant chemotherapy with Taxotere and Cytoxan every 21 days x4 cycles    08/21/2013 -  Anti-estrogen oral therapy    Tamoxifen 20 mg daily plan is for 10 years     REVIEW OF SYSTEMS:   Constitutional:  Denies fevers, chills or abnormal weight loss Eyes: Denies blurriness of vision Ears, nose, mouth, throat, and face: Denies mucositis or sore throat Respiratory: Denies cough, dyspnea or wheezes Cardiovascular: Denies palpitation, chest discomfort Gastrointestinal:  Denies nausea, heartburn or change in bowel habits Skin: Denies abnormal skin rashes Lymphatics: Denies new lymphadenopathy or easy bruising Neurological:Denies numbness, tingling or new weaknesses Behavioral/Psych: Mood is stable, no new changes  Extremities: No lower extremity edema Breast: denies any pain or lumps or nodules in either breasts All other systems were reviewed with the patient and are negative.  Observations/Objective:  There were no vitals filed for this visit. There is no height or weight on file to calculate BMI.  I have reviewed the data as listed CMP Latest Ref Rng & Units 07/23/2014 01/25/2014 09/12/2013  Glucose 70 - 140 mg/dl 89 102 73  BUN 7.0 - 26.0 mg/dL 14.8 11.6 13.9  Creatinine 0.6 - 1.1 mg/dL 0.7 0.8 0.7  Sodium 136 - 145 mEq/L 142 145 144  Potassium 3.5 - 5.1 mEq/L 4.2 3.8 3.9  Chloride 96 - 112 mEq/L - - -  CO2 22 - 29 mEq/L '28 25 28  ' Calcium 8.4 - 10.4 mg/dL 9.4 9.2 9.9  Total Protein 6.4 - 8.3 g/dL 6.5 7.0 6.6  Total Bilirubin 0.20 - 1.20 mg/dL 0.31 0.61 0.38  Alkaline Phos 40 - 150 U/L 67 62 66  AST 5 - 34 U/L '27 20 16  ' ALT 0 - 55 U/L 40 18 14    Lab Results  Component Value Date   WBC 4.9 07/23/2014  HGB 12.9 07/23/2014   HCT 39.9 07/23/2014   MCV 90.9 07/23/2014   PLT 218 07/23/2014   NEUTROABS 3.3 07/23/2014      Assessment Plan:  Breast cancer of lower-outer quadrant of left female breast ([place]) 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 Status post mastectomy followed by systemic chemotherapy and is currently on tamoxifen that started  08/21/2013 the plan of treatment 5-10 yearsswitched June 2019 to Letrozole  Side effects of Letrozole: 1. Hot flashes: Currently on Effexor, same as before 2. Muscle aches: on half tab daily 3. Difficulty with sleep: still poor  We discussed at length about duration of antiestrogen therapy.  Standard would be to give 5 years of letrozole. Patient is a self-pay and cannot do breast cancer index testing.  Breast Cancer Surveillance: 1. Breast exam 01/20/2018: Benign 2. Mammogram right breast Aug 2019No abnormalities. Postsurgical changes. Breast Density Category B.  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  Unfortunately with COVID-19 that has stopped.  She completed her wellness certification program.  Return to clinic in 1 year for follow-up   I discussed the assessment and treatment plan with the patient. The patient was provided an opportunity to ask questions and all were answered. The patient agreed with the plan and demonstrated an understanding of the instructions. The patient was advised to call back or seek an in-person evaluation if the symptoms worsen or if the condition fails to improve as anticipated.   I provided 15 minutes of face-to-face Doximity time during this encounter.    Rulon Eisenmenger, MD 01/24/2019   I, Molly Dorshimer, am acting as scribe for Nicholas Lose, MD.  I have reviewed the above documentation for accuracy and completeness, and I agree with the above.

## 2019-01-23 NOTE — Telephone Encounter (Signed)
Called patient regarding upcoming [facility] appointment, patient agreed to do a Doximity visit.

## 2019-01-24 ENCOUNTER — Inpatient Hospital Stay: Payer: Self-pay | Attending: Hematology and Oncology | Admitting: Hematology and Oncology

## 2019-01-24 DIAGNOSIS — C50512 Malignant neoplasm of lower-outer quadrant of left female breast: Secondary | ICD-10-CM

## 2019-01-24 DIAGNOSIS — Z170 Estrogen receptor positive status [ER+]: Secondary | ICD-10-CM

## 2019-01-24 MED ORDER — LETROZOLE 2.5 MG PO TABS: 2.5000 mg | ORAL_TABLET | Freq: Every day | ORAL | 3 refills | Status: DC

## 2019-01-24 MED ORDER — LEVOTHYROXINE SODIUM 112 MCG PO TABS: 112.0000 ug | ORAL_TABLET | Freq: Every day | ORAL | Status: AC

## 2019-01-24 NOTE — Assessment & Plan Note (Signed)
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 Status post mastectomy followed by systemic chemotherapy and is currently on tamoxifen that started 08/21/2013 the plan of treatment 5-10 yearsswitched June 2019 to Letrozole  Side effects of Letrozole: 1. Hot flashes: Currently on Effexor, same as before 2. Muscle aches: on half tab daily 3. Difficulty with sleep: still poor  Breast Cancer Surveillance: 1. Breast exam 01/20/2018: Benign 2. Mammogram right breast Aug 2019No abnormalities. Postsurgical changes. Breast Density Category B.  Plan:  Return to clinic in 1 year for follow-up

## 2019-02-07 ENCOUNTER — Other Ambulatory Visit: Payer: Self-pay | Admitting: Hematology and Oncology

## 2019-02-07 DIAGNOSIS — Z853 Personal history of malignant neoplasm of breast: Secondary | ICD-10-CM

## 2019-03-06 ENCOUNTER — Other Ambulatory Visit: Payer: Self-pay | Admitting: Adult Health

## 2019-03-06 DIAGNOSIS — C50512 Malignant neoplasm of lower-outer quadrant of left female breast: Secondary | ICD-10-CM

## 2019-03-07 NOTE — Telephone Encounter (Signed)
Please refill if appropriate

## 2019-03-20 ENCOUNTER — Ambulatory Visit
Admission: RE | Admit: 2019-03-20 | Discharge: 2019-03-20 | Disposition: A | Discharge status: Home / Self Care | Payer: Self-pay | Source: Ambulatory Visit | Attending: Hematology and Oncology | Admitting: Hematology and Oncology

## 2019-03-20 ENCOUNTER — Ambulatory Visit: Payer: Self-pay

## 2019-03-20 ENCOUNTER — Other Ambulatory Visit: Payer: Self-pay

## 2019-03-20 DIAGNOSIS — Z853 Personal history of malignant neoplasm of breast: Secondary | ICD-10-CM

## 2019-03-22 ENCOUNTER — Other Ambulatory Visit: Payer: Self-pay

## 2019-03-22 DIAGNOSIS — C50512 Malignant neoplasm of lower-outer quadrant of left female breast: Secondary | ICD-10-CM

## 2019-03-22 MED ORDER — VENLAFAXINE HCL ER 37.5 MG PO CP24: 37.5000 mg | ORAL_CAPSULE | Freq: Two times a day (BID) | ORAL | 2 refills | Status: DC

## 2020-01-22 NOTE — Progress Notes (Signed)
Patient Care Team: Maisie Fus, MD as PCP - General (Obstetrics and Gynecology)  DIAGNOSIS:    ICD-10-CM   1. Malignant neoplasm of lower-outer quadrant of left breast of female, estrogen receptor positive ([place])  C50.512    Z17.0     SUMMARY OF ONCOLOGIC HISTORY: Oncology History  Breast cancer of lower-outer quadrant of left female breast ([place])  03/16/2013 Initial Biopsy   Invasive ductal carcinoma grade 1, ER positive, PR positive, HER-2 negative, Ki-67 11%   03/24/2013 Breast MRI   Left breast bloody discharge mammogram mass 3.4 cm at 7:00, MRI revealed mass to be 5 cm   04/13/2013 Surgery   Left mastectomy: invasive ductal carcinoma 4.2 cm, grade 2, intermediate grade DCIS, 3 SLN negative, ER 100%, PR 100%, HER-2 negative ratio, insufficient tissue for Oncotype DX testing: Followed by breast reconstruction by Dr. Migdalia Dk   05/25/2013 - 07/28/2013 Chemotherapy   Adjuvant chemotherapy with Taxotere and Cytoxan every 21 days x4 cycles   08/21/2013 - 08/15/2020 Anti-estrogen oral therapy   Tamoxifen 20 mg daily switched to letrozole 2019     CHIEF COMPLIANT: Follow-up of left breast cancer on letrozole therapy  INTERVAL HISTORY: Amanda Coleman is a 54 y.o. with above-mentioned history of left breast cancer treated with left mastectomy, adjuvant chemotherapy, and is currently on anti-estrogen therapy with letrozole, after switching from tamoxifen when she entered menopause. Mammogram on 03/20/19 showed no evidence of malignancy in the right breast. She presents to the clinic today for annual follow-up.   She has profound hot flashes and continues to have problems with weight gain.  ALLERGIES:  is allergic to penicillins; sulfa antibiotics; and sulfamethoxazole.  MEDICATIONS:  Current Outpatient Medications  Medication Sig Dispense Refill   ALPRAZolam (XANAX) 0.5 MG tablet Take 0.5 mg by mouth as needed for sleep.     letrozole (FEMARA) 2.5 MG tablet Take 1 tablet (2.5 mg total) by  mouth daily. 90 tablet 3   levothyroxine (SYNTHROID) 112 MCG tablet Take 1 tablet (112 mcg total) by mouth daily before breakfast.     venlafaxine XR (EFFEXOR-XR) 37.5 MG 24 hr capsule TAKE ONE CAPSULE BY MOUTH TWO TIMES A DAY 60 capsule 12   venlafaxine XR (EFFEXOR-XR) 37.5 MG 24 hr capsule Take 1 capsule (37.5 mg total) by mouth 2 (two) times daily. 60 capsule 2   zolpidem (AMBIEN) 10 MG tablet Take 10 mg by mouth at bedtime as needed for sleep.     No current facility-administered medications for this visit.    PHYSICAL EXAMINATION: ECOG PERFORMANCE STATUS: 1 - Symptomatic but completely ambulatory  Vitals:   01/23/20 1036  BP: 135/75  Pulse: 82  Resp: 18  Temp: 98.5 F (36.9 C)  SpO2: 100%   Filed Weights   01/23/20 1036  Weight: 176 lb 1.6 oz (79.9 kg)    BREAST: No palpable masses or nodules in either right breast left mastectomy implant has moved lower and outer.. No palpable axillary supraclavicular or infraclavicular adenopathy no breast tenderness or nipple discharge. (exam performed in the presence of a chaperone)  LABORATORY DATA:  I have reviewed the data as listed CMP Latest Ref Rng & Units 07/23/2014 01/25/2014 09/12/2013  Glucose 70 - 140 mg/dl 89 102 73  BUN 7.0 - 26.0 mg/dL 14.8 11.6 13.9  Creatinine 0.6 - 1.1 mg/dL 0.7 0.8 0.7  Sodium 136 - 145 mEq/L 142 145 144  Potassium 3.5 - 5.1 mEq/L 4.2 3.8 3.9  Chloride 96 - 112 mEq/L - - -  CO2 22 - 29 mEq/L _0 Calcium 8.4 - 10.4 mg/dL 9.4 9.2 9.9  Total Protein 6.4 - 8.3 g/dL 6.5 7.0 6.6  Total Bilirubin 0.20 - 1.20 mg/dL 0.31 0.61 0.38  Alkaline Phos 40 - 150 U/L 67 62 66  AST 5 - 34 U/L _1 ALT 0 - 55 U/L 40 18 14    Lab Results  Component Value Date   WBC 4.9 07/23/2014   HGB 12.9 07/23/2014   HCT 39.9 07/23/2014   MCV 90.9 07/23/2014   PLT 218 07/23/2014   NEUTROABS 3.3 07/23/2014    ASSESSMENT & PLAN:  Breast cancer of lower-outer quadrant of left female breast ([facility]) Left breast  invasive ductal carcinoma 4.2 cm, grade 2, intermediate grade DCIS, 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 Status post mastectomy followed by systemic chemotherapy and is currently on tamoxifen that started 08/21/2013 the plan of treatment 5-10 yearsswitched June 2019 to Letrozole  Side effects of Letrozole: 1. Hot flashes: Currently on Effexor, same as before 2. Muscle aches: on half tab daily 3. Difficulty with sleep: still poor  We discussed at length about duration of antiestrogen therapy.  Standard would be to give 5 years of letrozole. Patient is a self-pay and cannot do breast cancer index testing.  Breast Cancer Surveillance: 1. Breast exam 01/23/2020:Benign 2. Mammogramright breast  8/4/2020benign postsurgical changes. Breast Density Category C.  Plan:  She will stop letrozole at the end of this year. 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 Return to clinic in 1 year for follow-up After that we can see her on an as-needed basis.   No orders of the defined types were placed in this encounter.  The patient has a good understanding of the overall plan. she agrees with it. she will call with any problems that may develop before the next visit here.  Total time spent: 20 mins including face to face time and time spent for planning, charting and coordination of care  Nicholas Lose, MD 01/23/2020  I, Cloyde Reams Dorshimer, am acting as scribe for Dr. Nicholas Lose.  I have reviewed the above documentation for accuracy and completeness, and I agree with the above.

## 2020-01-23 ENCOUNTER — Telehealth: Payer: Self-pay | Admitting: Hematology and Oncology

## 2020-01-23 ENCOUNTER — Other Ambulatory Visit: Payer: Self-pay

## 2020-01-23 ENCOUNTER — Inpatient Hospital Stay: Payer: Self-pay | Attending: Hematology and Oncology | Admitting: Hematology and Oncology

## 2020-01-23 DIAGNOSIS — N951 Menopausal and female climacteric states: Secondary | ICD-10-CM | POA: Insufficient documentation

## 2020-01-23 DIAGNOSIS — Z170 Estrogen receptor positive status [ER+]: Secondary | ICD-10-CM | POA: Insufficient documentation

## 2020-01-23 DIAGNOSIS — C50512 Malignant neoplasm of lower-outer quadrant of left female breast: Secondary | ICD-10-CM | POA: Insufficient documentation

## 2020-01-23 DIAGNOSIS — Z79811 Long term (current) use of aromatase inhibitors: Secondary | ICD-10-CM | POA: Insufficient documentation

## 2020-01-23 DIAGNOSIS — Z9012 Acquired absence of left breast and nipple: Secondary | ICD-10-CM | POA: Insufficient documentation

## 2020-01-23 DIAGNOSIS — Z79899 Other long term (current) drug therapy: Secondary | ICD-10-CM | POA: Insufficient documentation

## 2020-01-23 NOTE — Telephone Encounter (Signed)
Scheduled appts per 6/8 los. Gave pt a print out of AVS.

## 2020-01-23 NOTE — Assessment & Plan Note (Signed)
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 Status post mastectomy followed by systemic chemotherapy and is currently on tamoxifen that started 08/21/2013 the plan of treatment 5-10 yearsswitched June 2019 to Letrozole  Side effects of Letrozole: 1. Hot flashes: Currently on Effexor, same as before 2. Muscle aches: on half tab daily 3. Difficulty with sleep: still poor  We discussed at length about duration of antiestrogen therapy.  Standard would be to give 5 years of letrozole. Patient is a self-pay and cannot do breast cancer index testing.  Breast Cancer Surveillance: 1. Breast exam 01/23/2020:Benign 2. Mammogramright breast  8/4/2020benign postsurgical changes. Breast Density Category C.  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  Unfortunately with COVID-19 that has stopped.  She completed her wellness certification program.  Return to clinic in 1 year for follow-up

## 2020-01-29 ENCOUNTER — Ambulatory Visit: Payer: Self-pay | Admitting: Hematology and Oncology

## 2020-02-01 ENCOUNTER — Other Ambulatory Visit: Payer: Self-pay | Admitting: *Deleted

## 2020-02-01 MED ORDER — LETROZOLE 2.5 MG PO TABS: 2.5000 mg | ORAL_TABLET | Freq: Every day | ORAL | 3 refills | Status: DC

## 2020-02-08 ENCOUNTER — Other Ambulatory Visit: Payer: Self-pay | Admitting: Hematology and Oncology

## 2020-02-08 DIAGNOSIS — Z1231 Encounter for screening mammogram for malignant neoplasm of breast: Secondary | ICD-10-CM

## 2020-02-08 MED ORDER — ZOLPIDEM TARTRATE 10 MG PO TABS: 10.0000 mg | ORAL_TABLET | Freq: Every evening | ORAL | 3 refills | Status: DC | PRN

## 2020-03-20 ENCOUNTER — Ambulatory Visit
Admission: RE | Admit: 2020-03-20 | Discharge: 2020-03-20 | Disposition: A | Discharge status: Home / Self Care | Payer: No Typology Code available for payment source | Source: Ambulatory Visit | Attending: Hematology and Oncology | Admitting: Hematology and Oncology

## 2020-03-20 ENCOUNTER — Other Ambulatory Visit: Payer: Self-pay

## 2020-03-20 DIAGNOSIS — Z1231 Encounter for screening mammogram for malignant neoplasm of breast: Secondary | ICD-10-CM

## 2020-05-01 ENCOUNTER — Other Ambulatory Visit: Payer: Self-pay | Admitting: Hematology and Oncology

## 2020-05-01 DIAGNOSIS — Z170 Estrogen receptor positive status [ER+]: Secondary | ICD-10-CM

## 2020-05-02 ENCOUNTER — Other Ambulatory Visit: Payer: Self-pay | Admitting: *Deleted

## 2020-05-02 MED ORDER — PROCHLORPERAZINE MALEATE 10 MG PO TABS: 10.0000 mg | ORAL_TABLET | Freq: Four times a day (QID) | ORAL | 3 refills | Status: AC | PRN

## 2020-05-02 NOTE — Telephone Encounter (Signed)
Pt called with c/o moments of nausea and request refill on compazine. Per Dr. Lindi Adie new prescription sent in to pharmacy.

## 2021-01-25 NOTE — Progress Notes (Signed)
Patient Care Team: Maisie Fus, MD as PCP - General (Obstetrics and Gynecology)  DIAGNOSIS:    ICD-10-CM   1. Malignant neoplasm of lower-outer quadrant of left breast of female, estrogen receptor positive ([place])  C50.512    Z17.0       SUMMARY OF ONCOLOGIC HISTORY: Oncology History  Breast cancer of lower-outer quadrant of left female breast ([place])  03/16/2013 Initial Biopsy   Invasive ductal carcinoma grade 1, ER positive, PR positive, HER-2 negative, Ki-67 11%    03/24/2013 Breast MRI   Left breast bloody discharge mammogram mass 3.4 cm at 7:00, MRI revealed mass to be 5 cm    04/13/2013 Surgery   Left mastectomy: invasive ductal carcinoma 4.2 cm, grade 2, intermediate grade DCIS, 3 SLN negative, ER 100%, PR 100%, HER-2 negative ratio, insufficient tissue for Oncotype DX testing: Followed by breast reconstruction by Dr. Migdalia Dk    05/25/2013 - 07/28/2013 Chemotherapy   Adjuvant chemotherapy with Taxotere and Cytoxan every 21 days x4 cycles    08/21/2013 - 08/15/2020 Anti-estrogen oral therapy   Tamoxifen 20 mg daily switched to letrozole 2019     CHIEF COMPLIANT: Follow-up of let breast cancer on letrozole therapy  INTERVAL HISTORY: Amanda Coleman is a 55 y.o. with above-mentioned history of left breast cancer treated with left mastectomy, adjuvant chemotherapy, and is currently on anti-estrogen therapy with letrozole, after switching from tamoxifen when she entered menopause. Mammogram on 03/20/2020 shows no evidence of malignancy in the right breast. She presents to the clinic today for annual follow-up.  Her major concerns today are result of her weight gain issues.  ALLERGIES:  is allergic to penicillins, sulfa antibiotics, and sulfamethoxazole.  MEDICATIONS:  Current Outpatient Medications  Medication Sig Dispense Refill   ALPRAZolam (XANAX) 0.5 MG tablet Take 0.5 mg by mouth as needed for sleep.     levothyroxine (SYNTHROID) 112 MCG tablet Take 1 tablet (112 mcg  total) by mouth daily before breakfast.     prochlorperazine (COMPAZINE) 10 MG tablet Take 1 tablet (10 mg total) by mouth every 6 (six) hours as needed for nausea or vomiting. 30 tablet 3   zolpidem (AMBIEN) 10 MG tablet Take 1 tablet (10 mg total) by mouth at bedtime as needed for sleep. 30 tablet 3   No current facility-administered medications for this visit.    PHYSICAL EXAMINATION: ECOG PERFORMANCE STATUS: 1 - Symptomatic but completely ambulatory  Vitals:   01/27/21 0934  BP: 135/69  Pulse: 61  Resp: 16  Temp: (!) 97.5 F (36.4 C)  SpO2: 100%   Filed Weights   01/27/21 0934  Weight: 169 lb 3.2 oz (76.7 kg)    BREAST: No palpable masses or nodules in either right or left breasts. No palpable axillary supraclavicular or infraclavicular adenopathy no breast tenderness or nipple discharge. (exam performed in the presence of a chaperone)  LABORATORY DATA:  I have reviewed the data as listed CMP Latest Ref Rng & Units 07/23/2014 01/25/2014 09/12/2013  Glucose 70 - 140 mg/dl 89 102 73  BUN 7.0 - 26.0 mg/dL 14.8 11.6 13.9  Creatinine 0.6 - 1.1 mg/dL 0.7 0.8 0.7  Sodium 136 - 145 mEq/L 142 145 144  Potassium 3.5 - 5.1 mEq/L 4.2 3.8 3.9  Chloride 96 - 112 mEq/L - - -  CO2 22 - 29 mEq/L '28 25 28  ' Calcium 8.4 - 10.4 mg/dL 9.4 9.2 9.9  Total Protein 6.4 - 8.3 g/dL 6.5 7.0 6.6  Total Bilirubin 0.20 - 1.20 mg/dL  0.31 0.61 0.38  Alkaline Phos 40 - 150 U/L 67 62 66  AST 5 - 34 U/L '27 20 16  ' ALT 0 - 55 U/L 40 18 14    Lab Results  Component Value Date   WBC 4.9 07/23/2014   HGB 12.9 07/23/2014   HCT 39.9 07/23/2014   MCV 90.9 07/23/2014   PLT 218 07/23/2014   NEUTROABS 3.3 07/23/2014    ASSESSMENT & PLAN:  Breast cancer of lower-outer quadrant of left female breast ([facility]) 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 Status post mastectomy followed  by systemic chemotherapy and is currently on tamoxifen that started 08/21/2013 the plan of treatment 5-10 years switched June 2019 to Letrozole started December 2021      Breast Cancer Surveillance: 1. Breast exam 01/27/21: Benign 2. Mammogram right breast  03/22/20 benign postsurgical changes. Breast Density Category C.  Weight issues: I discussed with her about intermittent fasting and she will accompany that with walking 5K every day.  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 Return to clinic in 1 year for follow-up    No orders of the defined types were placed in this encounter.  The patient has a good understanding of the overall plan. she agrees with it. she will call with any problems that may develop before the next visit here.  Total time spent: 20 mins including face to face time and time spent for planning, charting and coordination of care  Rulon Eisenmenger, MD, MPH 01/27/2021  I, Thana Ates, am acting as scribe for Dr. Nicholas Lose.  I have reviewed the above documentation for accuracy and completeness, and I agree with the above.

## 2021-01-26 NOTE — Assessment & Plan Note (Signed)
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 Status post mastectomy followed by systemic chemotherapy and is currently on tamoxifen that started 08/21/2013 the plan of treatment 5-10 yearsswitched June 2019 to Letrozole  Side effects ofLetrozole: 1. Hot flashes: Currently on Effexor,same as before 2.Muscle aches: on half tab daily 3. Difficulty with sleep:still poor  We discussed at length about duration of antiestrogen therapy. Standard would be to give 5 years of letrozole. Patient is a self-pay and cannot do breast cancer index testing.  Breast Cancer Surveillance: 1. Breast exam 01/27/21:Benign 2. Mammogramright breast 8/6/21benign postsurgical changes. Breast Density Category C.  Plan: She stopped letrozole at the end of 2021  Patient has a wellness certification and would like to assist our patients in their survivorship clinic. Return to clinic in 1 year for follow-up

## 2021-01-27 ENCOUNTER — Inpatient Hospital Stay
Payer: No Typology Code available for payment source | Attending: Hematology and Oncology | Admitting: Hematology and Oncology

## 2021-01-27 ENCOUNTER — Other Ambulatory Visit: Payer: Self-pay

## 2021-01-27 DIAGNOSIS — Z9012 Acquired absence of left breast and nipple: Secondary | ICD-10-CM | POA: Insufficient documentation

## 2021-01-27 DIAGNOSIS — Z79811 Long term (current) use of aromatase inhibitors: Secondary | ICD-10-CM | POA: Insufficient documentation

## 2021-01-27 DIAGNOSIS — Z170 Estrogen receptor positive status [ER+]: Secondary | ICD-10-CM | POA: Insufficient documentation

## 2021-01-27 DIAGNOSIS — C50512 Malignant neoplasm of lower-outer quadrant of left female breast: Secondary | ICD-10-CM | POA: Insufficient documentation

## 2021-01-27 DIAGNOSIS — Z9221 Personal history of antineoplastic chemotherapy: Secondary | ICD-10-CM | POA: Insufficient documentation

## 2021-01-28 ENCOUNTER — Other Ambulatory Visit: Payer: Self-pay | Admitting: Hematology and Oncology

## 2021-01-28 DIAGNOSIS — Z1231 Encounter for screening mammogram for malignant neoplasm of breast: Secondary | ICD-10-CM

## 2021-03-21 ENCOUNTER — Ambulatory Visit
Admission: RE | Admit: 2021-03-21 | Discharge: 2021-03-21 | Disposition: A | Discharge status: Home / Self Care | Payer: No Typology Code available for payment source | Source: Ambulatory Visit | Attending: Hematology and Oncology | Admitting: Hematology and Oncology

## 2021-03-21 ENCOUNTER — Other Ambulatory Visit: Payer: Self-pay

## 2021-03-21 DIAGNOSIS — Z1231 Encounter for screening mammogram for malignant neoplasm of breast: Secondary | ICD-10-CM

## 2022-01-01 ENCOUNTER — Telehealth: Payer: Self-pay | Admitting: Hematology and Oncology

## 2022-01-01 NOTE — Telephone Encounter (Signed)
Rescheduled appointment per provider PAL. Left message. 

## 2022-01-27 ENCOUNTER — Ambulatory Visit: Payer: No Typology Code available for payment source | Admitting: Hematology and Oncology

## 2022-02-05 ENCOUNTER — Other Ambulatory Visit: Payer: Self-pay | Admitting: Hematology and Oncology

## 2022-02-05 DIAGNOSIS — Z1231 Encounter for screening mammogram for malignant neoplasm of breast: Secondary | ICD-10-CM

## 2022-02-12 NOTE — Progress Notes (Signed)
Patient Care Team: Maisie Fus, MD as PCP - General (Obstetrics and Gynecology)  DIAGNOSIS:  Encounter Diagnosis  Name Primary?   Malignant neoplasm of lower-outer quadrant of left breast of female, estrogen receptor positive ([place])     SUMMARY OF ONCOLOGIC HISTORY: Oncology History  Breast cancer of lower-outer quadrant of left female breast ([place])  03/16/2013 Initial Biopsy   Invasive ductal carcinoma grade 1, ER positive, PR positive, HER-2 negative, Ki-67 11%   03/24/2013 Breast MRI   Left breast bloody discharge mammogram mass 3.4 cm at 7:00, MRI revealed mass to be 5 cm   04/13/2013 Surgery   Left mastectomy: invasive ductal carcinoma 4.2 cm, grade 2, intermediate grade DCIS, 3 SLN negative, ER 100%, PR 100%, HER-2 negative ratio, insufficient tissue for Oncotype DX testing: Followed by breast reconstruction by Dr. Migdalia Dk   05/25/2013 - 07/28/2013 Chemotherapy   Adjuvant chemotherapy with Taxotere and Cytoxan every 21 days x4 cycles   08/21/2013 - 08/15/2020 Anti-estrogen oral therapy   Tamoxifen 20 mg daily switched to letrozole 2019     CHIEF COMPLIANT:  Follow-up of left breast cancer     INTERVAL HISTORY: Amanda Coleman is a 56 y.o. with above-mentioned history of left breast cancer. She presents to the clinic today for a follow-up. She states that she is doing well. She is having some discomfort on the right side of breast. She have been having a lot of stress. She complains of skin tags. She is having severe hot flashes that she feels they are getting more intense. She also is feeling overwhelmed. She also have sleep issues.    ALLERGIES:  is allergic to penicillins, sulfa antibiotics, and sulfamethoxazole.  MEDICATIONS:  Current Outpatient Medications  Medication Sig Dispense Refill   ALPRAZolam (XANAX) 0.5 MG tablet Take 0.5 mg by mouth as needed for sleep.     levothyroxine (SYNTHROID) 112 MCG tablet Take 1 tablet (112 mcg total) by mouth daily before  breakfast.     prochlorperazine (COMPAZINE) 10 MG tablet Take 1 tablet (10 mg total) by mouth every 6 (six) hours as needed for nausea or vomiting. 30 tablet 3   zolpidem (AMBIEN) 10 MG tablet Take 1 tablet (10 mg total) by mouth at bedtime as needed for sleep. 30 tablet 3   No current facility-administered medications for this visit.    PHYSICAL EXAMINATION: ECOG PERFORMANCE STATUS: 1 - Symptomatic but completely ambulatory  Vitals:   02/26/22 1019  BP: 139/80  Pulse: 80  Resp: 16  Temp: (!) 97.5 F (36.4 C)  SpO2: 99%   Filed Weights   02/26/22 1019  Weight: 165 lb 11.2 oz (75.2 kg)    BREAST: No palpable masses or nodules in either right or left breasts. No palpable axillary supraclavicular or infraclavicular adenopathy no breast tenderness or nipple discharge. (exam performed in the presence of a chaperone)  LABORATORY DATA:  I have reviewed the data as listed    Latest Ref Rng & Units 07/23/2014    9:34 AM 01/25/2014   10:29 AM 09/12/2013    9:18 AM  CMP  Glucose 70 - 140 mg/dl 89  102  73   BUN 7.0 - 26.0 mg/dL 14.8  11.6  13.9   Creatinine 0.6 - 1.1 mg/dL 0.7  0.8  0.7   Sodium 136 - 145 mEq/L 142  145  144   Potassium 3.5 - 5.1 mEq/L 4.2  3.8  3.9   CO2 22 - 29 mEq/L 28  25  28   Calcium 8.4 - 10.4 mg/dL 9.4  9.2  9.9   Total Protein 6.4 - 8.3 g/dL 6.5  7.0  6.6   Total Bilirubin 0.20 - 1.20 mg/dL 0.31  0.61  0.38   Alkaline Phos 40 - 150 U/L 67  62  66   AST 5 - 34 U/L _0 ALT 0 - 55 U/L 40  18  14     Lab Results  Component Value Date   WBC 4.9 07/23/2014   HGB 12.9 07/23/2014   HCT 39.9 07/23/2014   MCV 90.9 07/23/2014   PLT 218 07/23/2014   NEUTROABS 3.3 07/23/2014    ASSESSMENT & PLAN:  Breast cancer of lower-outer quadrant of left female breast ([facility]) 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 Status post  mastectomy followed by systemic chemotherapy and is currently on tamoxifen that started 08/21/2013 the plan of treatment 5-10 years switched June 2019 to Letrozole  completed December 2021       Breast Cancer Surveillance: 1. Breast exam 02/26/22: Benign 2. Mammogram right breast  scheduled for 03/23/22    Weight issues: Because of her husband's colon cancer recurrence she has not been paying attention to her weight.  She has not been able to exercise.  Insomnia: Her PCP is retiring and therefore I will refill her Ambien until she finds a different primary care.    Return to clinic in 1 year for follow-up    No orders of the defined types were placed in this encounter.  The patient has a good understanding of the overall plan. she agrees with it. she will call with any problems that may develop before the next visit here. Total time spent: 30 mins including face to face time and time spent for planning, charting and co-ordination of care   Harriette Ohara, MD 02/26/22    I Gardiner Coins am scribing for Dr. Lindi Adie  I have reviewed the above documentation for accuracy and completeness, and I agree with the above.

## 2022-02-19 ENCOUNTER — Ambulatory Visit: Payer: No Typology Code available for payment source | Admitting: Hematology and Oncology

## 2022-02-25 NOTE — Assessment & Plan Note (Signed)
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 Status post mastectomy followed by systemic chemotherapy and is currently on tamoxifen that started 08/21/2013 the plan of treatment 5-10 yearsswitched June 2019 to Letrozole started December 2021    Breast Cancer Surveillance: 1. Breast exam7/13/23:Benign 2. Mammogramright breastscheduled for 03/23/22  Weight issues: I discussed with her about intermittent fasting and she will accompany that with walking 5K every day.  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 Return to clinic in 1 year for follow-up

## 2022-02-26 ENCOUNTER — Inpatient Hospital Stay: Payer: BC Managed Care – PPO | Attending: Hematology and Oncology | Admitting: Hematology and Oncology

## 2022-02-26 ENCOUNTER — Other Ambulatory Visit: Payer: Self-pay

## 2022-02-26 DIAGNOSIS — Z170 Estrogen receptor positive status [ER+]: Secondary | ICD-10-CM | POA: Diagnosis not present

## 2022-02-26 DIAGNOSIS — Z79810 Long term (current) use of selective estrogen receptor modulators (SERMs): Secondary | ICD-10-CM | POA: Insufficient documentation

## 2022-02-26 DIAGNOSIS — C50512 Malignant neoplasm of lower-outer quadrant of left female breast: Secondary | ICD-10-CM | POA: Insufficient documentation

## 2022-02-26 MED ORDER — ZOLPIDEM TARTRATE 10 MG PO TABS: 10.0000 mg | ORAL_TABLET | Freq: Every evening | ORAL | 3 refills | Status: AC | PRN

## 2022-03-23 ENCOUNTER — Ambulatory Visit
Admission: RE | Admit: 2022-03-23 | Discharge: 2022-03-23 | Disposition: A | Discharge status: Home / Self Care | Payer: BC Managed Care – PPO | Source: Ambulatory Visit | Attending: Hematology and Oncology | Admitting: Hematology and Oncology

## 2022-03-23 DIAGNOSIS — Z1231 Encounter for screening mammogram for malignant neoplasm of breast: Secondary | ICD-10-CM

## 2022-12-28 ENCOUNTER — Other Ambulatory Visit: Payer: Self-pay | Admitting: Hematology and Oncology

## 2022-12-28 DIAGNOSIS — Z0000 Encounter for general adult medical examination without abnormal findings: Secondary | ICD-10-CM

## 2023-02-27 NOTE — Progress Notes (Signed)
Patient Care Team: Cleatis Polka., MD as PCP - General (Internal Medicine)  DIAGNOSIS:  Encounter Diagnosis  Name Primary?   Malignant neoplasm of lower-outer quadrant of left breast of female, estrogen receptor positive ([facility]) Yes    SUMMARY OF ONCOLOGIC HISTORY: Oncology History  Breast cancer of lower-outer quadrant of left female breast ([facility])  03/16/2013 Initial Biopsy   Invasive ductal carcinoma grade 1, ER positive, PR positive, HER-2 negative, Ki-67 11%   03/24/2013 Breast MRI   Left breast bloody discharge mammogram mass 3.4 cm at 7:00, MRI revealed mass to be 5 cm   04/13/2013 Surgery   Left mastectomy: invasive ductal carcinoma 4.2 cm, grade 2, intermediate grade DCIS, 3 SLN negative, ER 100%, PR 100%, HER-2 negative ratio, insufficient tissue for Oncotype DX testing: Followed by breast reconstruction by Dr. Kelly Splinter   05/25/2013 - 07/28/2013 Chemotherapy   Adjuvant chemotherapy with Taxotere and Cytoxan every 21 days x4 cycles   08/21/2013 - 08/15/2020 Anti-estrogen oral therapy   Tamoxifen 20 mg daily switched to letrozole 2019     CHIEF COMPLIANT: Follow-up of left breast cancer   INTERVAL HISTORY: Amanda Coleman is a 57 y.o. with above-mentioned history of left breast cancer surveillance. She presents to the clinic today for a follow-up. She reports that everything is going well.   ALLERGIES:  is allergic to penicillins, sulfa antibiotics, and sulfamethoxazole.  MEDICATIONS:  Current Outpatient Medications  Medication Sig Dispense Refill   ALPRAZolam (XANAX) 0.5 MG tablet Take 0.5 mg by mouth as needed for sleep.     levothyroxine (SYNTHROID) 112 MCG tablet Take 1 tablet (112 mcg total) by mouth daily before breakfast.     prochlorperazine (COMPAZINE) 10 MG tablet Take 1 tablet (10 mg total) by mouth every 6 (six) hours as needed for nausea or vomiting. 30 tablet 3   zolpidem (AMBIEN) 10 MG tablet Take 1 tablet (10 mg total) by mouth at bedtime as needed for  sleep. 30 tablet 3   No current facility-administered medications for this visit.    PHYSICAL EXAMINATION: ECOG PERFORMANCE STATUS: 1 - Symptomatic but completely ambulatory  Vitals:   03/02/23 0956  BP: (!) 153/90  Pulse: 86  Resp: 18  Temp: 97.8 F (36.6 C)  SpO2: 99%   Filed Weights   03/02/23 0956  Weight: 156 lb 11.2 oz (71.1 kg)    BREAST: No palpable masses or nodules in either right or left breasts. No palpable axillary supraclavicular or infraclavicular adenopathy no breast tenderness or nipple discharge. (exam performed in the presence of a chaperone)  LABORATORY DATA:  I have reviewed the data as listed    Latest Ref Rng & Units 07/23/2014    9:34 AM 01/25/2014   10:29 AM 09/12/2013    9:18 AM  CMP  Glucose 70 - 140 mg/dl 89  161  73   BUN 7.0 - 26.0 mg/dL 09.6  04.5  40.9   Creatinine 0.6 - 1.1 mg/dL 0.7  0.8  0.7   Sodium 136 - 145 mEq/L 142  145  144   Potassium 3.5 - 5.1 mEq/L 4.2  3.8  3.9   CO2 22 - 29 mEq/L 28  25  28    Calcium 8.4 - 10.4 mg/dL 9.4  9.2  9.9   Total Protein 6.4 - 8.3 g/dL 6.5  7.0  6.6   Total Bilirubin 0.20 - 1.20 mg/dL 8.11  9.14  7.82   Alkaline Phos 40 - 150 U/L 67  62  66   AST 5 - 34 U/L 27  20  16    ALT 0 - 55 U/L 40  18  14     Lab Results  Component Value Date   WBC 4.9 07/23/2014   HGB 12.9 07/23/2014   HCT 39.9 07/23/2014   MCV 90.9 07/23/2014   PLT 218 07/23/2014   NEUTROABS 3.3 07/23/2014    ASSESSMENT & PLAN:  Breast cancer of lower-outer quadrant of left female breast ([facility]) 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 Status post mastectomy followed by systemic chemotherapy and is currently on tamoxifen that started 08/21/2013 the plan of treatment 5-10 years switched June 2019 to Letrozole  completed December 2021       Breast Cancer Surveillance: 1. Breast exam 03/02/2023:  Benign 2. Mammogram right breast 03/23/22: Benign breast density category C   Her husband is doing extremely well from the colon cancer recurrence that he had a year ago. Return to clinic in 1 year for follow-up    No orders of the defined types were placed in this encounter.  The patient has a good understanding of the overall plan. she agrees with it. she will call with any problems that may develop before the next visit here. Total time spent: 30 mins including face to face time and time spent for planning, charting and co-ordination of care   Tamsen Meek, MD 03/02/23    I Janan Ridge am acting as a Neurosurgeon for [employer]  I have reviewed the above documentation for accuracy and completeness, and I agree with the above.

## 2023-03-02 ENCOUNTER — Other Ambulatory Visit: Payer: Self-pay

## 2023-03-02 ENCOUNTER — Inpatient Hospital Stay: Payer: BC Managed Care – PPO | Attending: Hematology and Oncology | Admitting: Hematology and Oncology

## 2023-03-02 VITALS — BP 153/90 | HR 86 | Temp 97.8°F | Resp 18 | Ht 64.0 in | Wt 156.7 lb

## 2023-03-02 DIAGNOSIS — Z79810 Long term (current) use of selective estrogen receptor modulators (SERMs): Secondary | ICD-10-CM | POA: Diagnosis present

## 2023-03-02 DIAGNOSIS — Z170 Estrogen receptor positive status [ER+]: Secondary | ICD-10-CM

## 2023-03-02 DIAGNOSIS — C50512 Malignant neoplasm of lower-outer quadrant of left female breast: Secondary | ICD-10-CM

## 2023-03-02 NOTE — Assessment & Plan Note (Signed)
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 Status post mastectomy followed by systemic chemotherapy and is currently on tamoxifen that started 08/21/2013 the plan of treatment 5-10 years switched June 2019 to Letrozole  completed December 2021       Breast Cancer Surveillance: 1. Breast exam 03/02/2023: Benign 2. Mammogram right breast 03/23/22: Benign breast density category C   Weight issues: Because of her husband's colon cancer recurrence she has not been paying attention to her weight.  She has not been able to exercise.   Return to clinic in 1 year for follow-up

## 2023-03-25 ENCOUNTER — Ambulatory Visit
Admission: RE | Admit: 2023-03-25 | Discharge: 2023-03-25 | Disposition: A | Discharge status: Home / Self Care | Payer: BC Managed Care – PPO | Source: Ambulatory Visit | Attending: Hematology and Oncology | Admitting: Hematology and Oncology

## 2023-03-25 DIAGNOSIS — Z0000 Encounter for general adult medical examination without abnormal findings: Secondary | ICD-10-CM

## 2023-05-26 ENCOUNTER — Encounter: Payer: Self-pay | Admitting: Hematology and Oncology

## 2023-06-30 ENCOUNTER — Encounter: Payer: Self-pay | Admitting: Hematology and Oncology

## 2023-07-04 IMAGING — MG MM DIGITAL SCREENING UNILAT*R* W/ TOMO W/ CAD
4 series · 4 of 12 positions shown · non-contrast
Comparison: Previous exam(s).

CLINICAL DATA: Screening.

EXAM:
DIGITAL SCREENING UNILATERAL RIGHT MAMMOGRAM WITH CAD AND
TOMOSYNTHESIS
TECHNIQUE: Right screening digital craniocaudal and mediolateral oblique
mammograms were obtained. Right screening digital breast
tomosynthesis was performed. The images were evaluated with
computer-aided detection.

[R CC synth-2D]
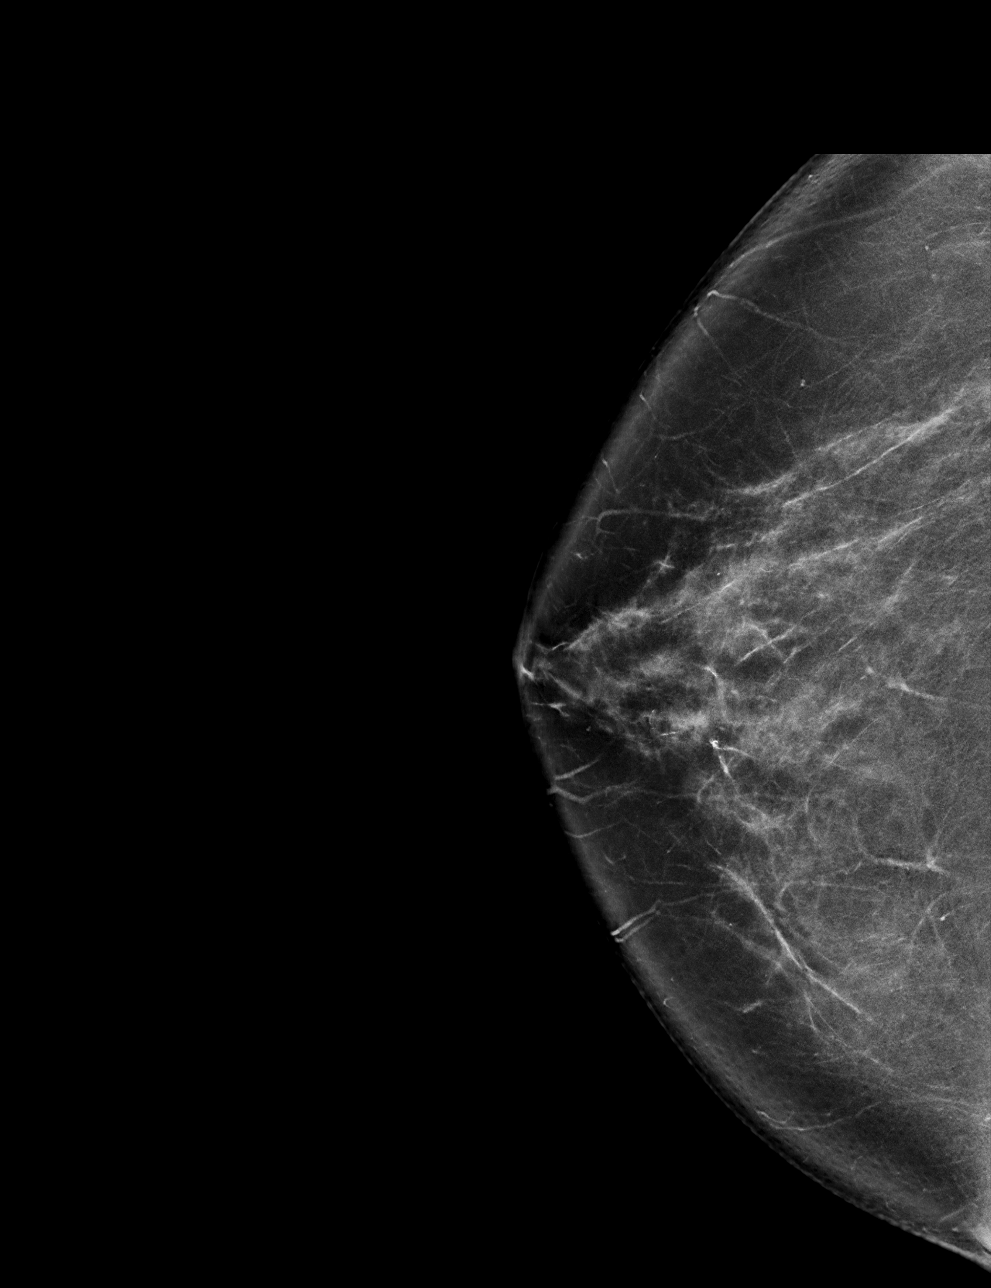

[R MLO synth-2D]
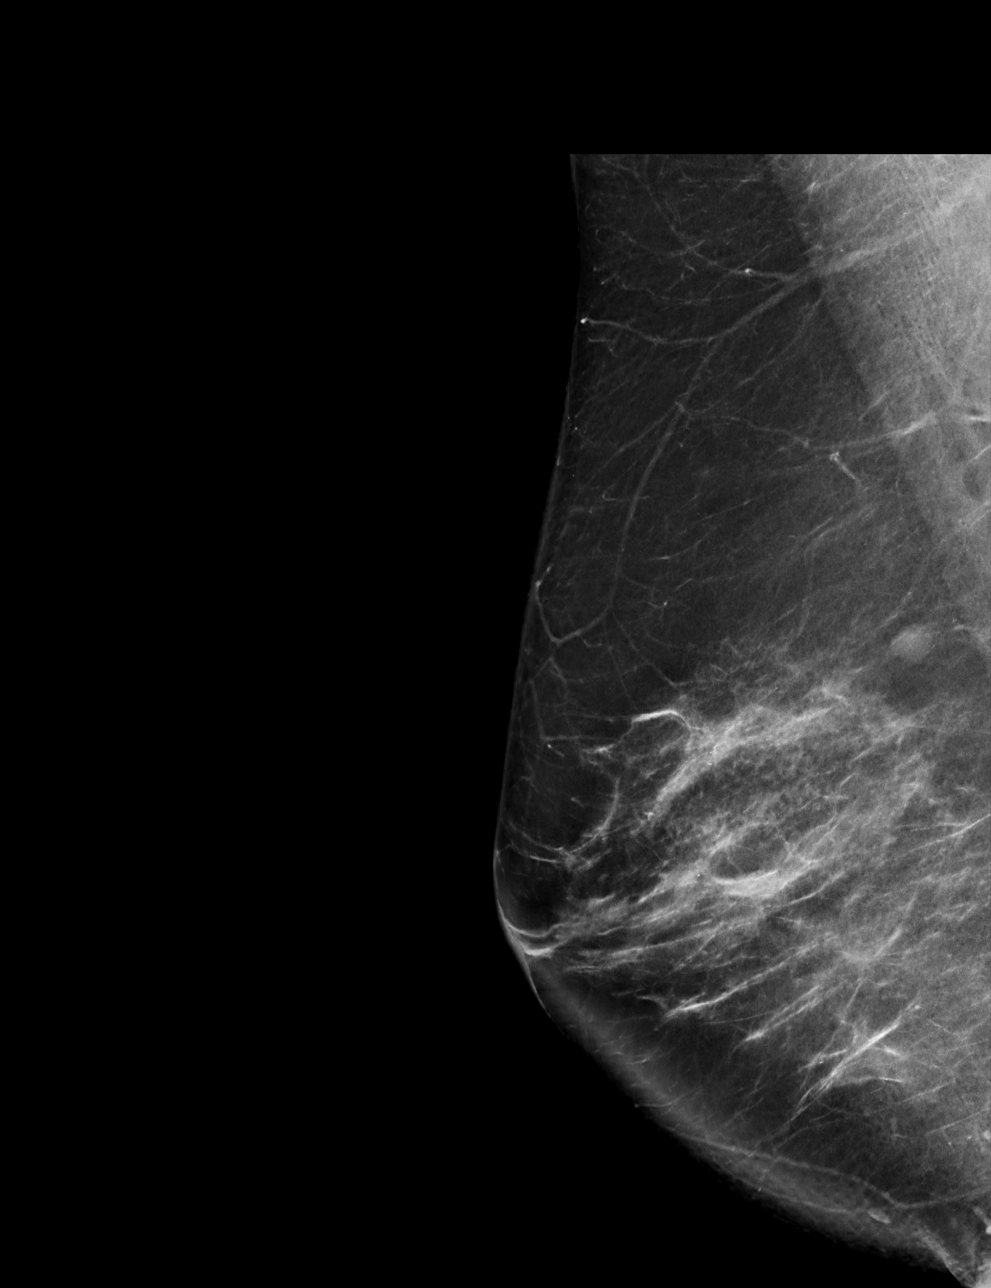

[R MLO tomo · tomo slice 41/80.0]
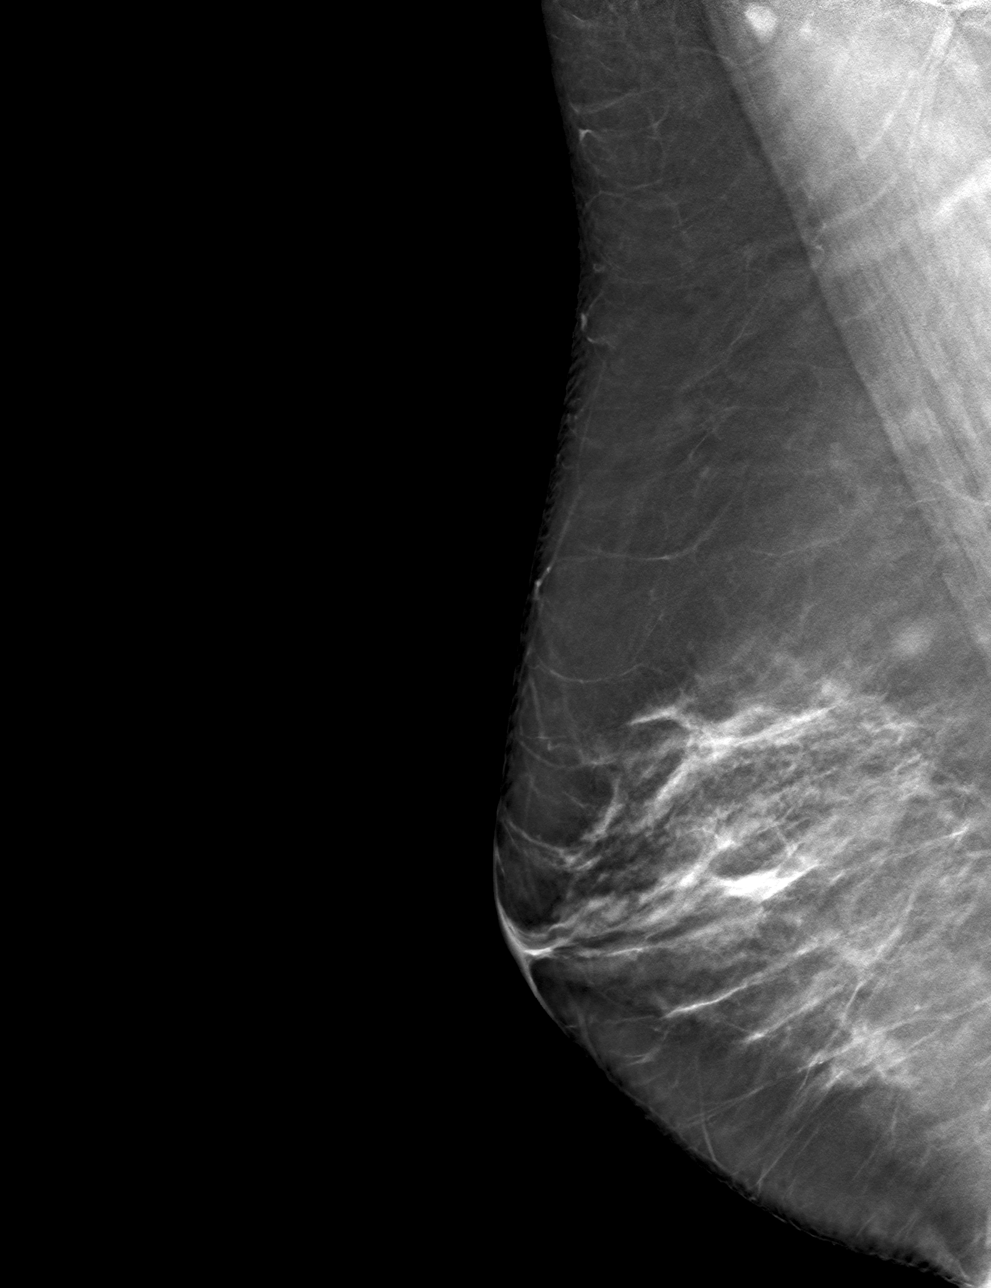

[R CC tomo · tomo slice 41/82.0]
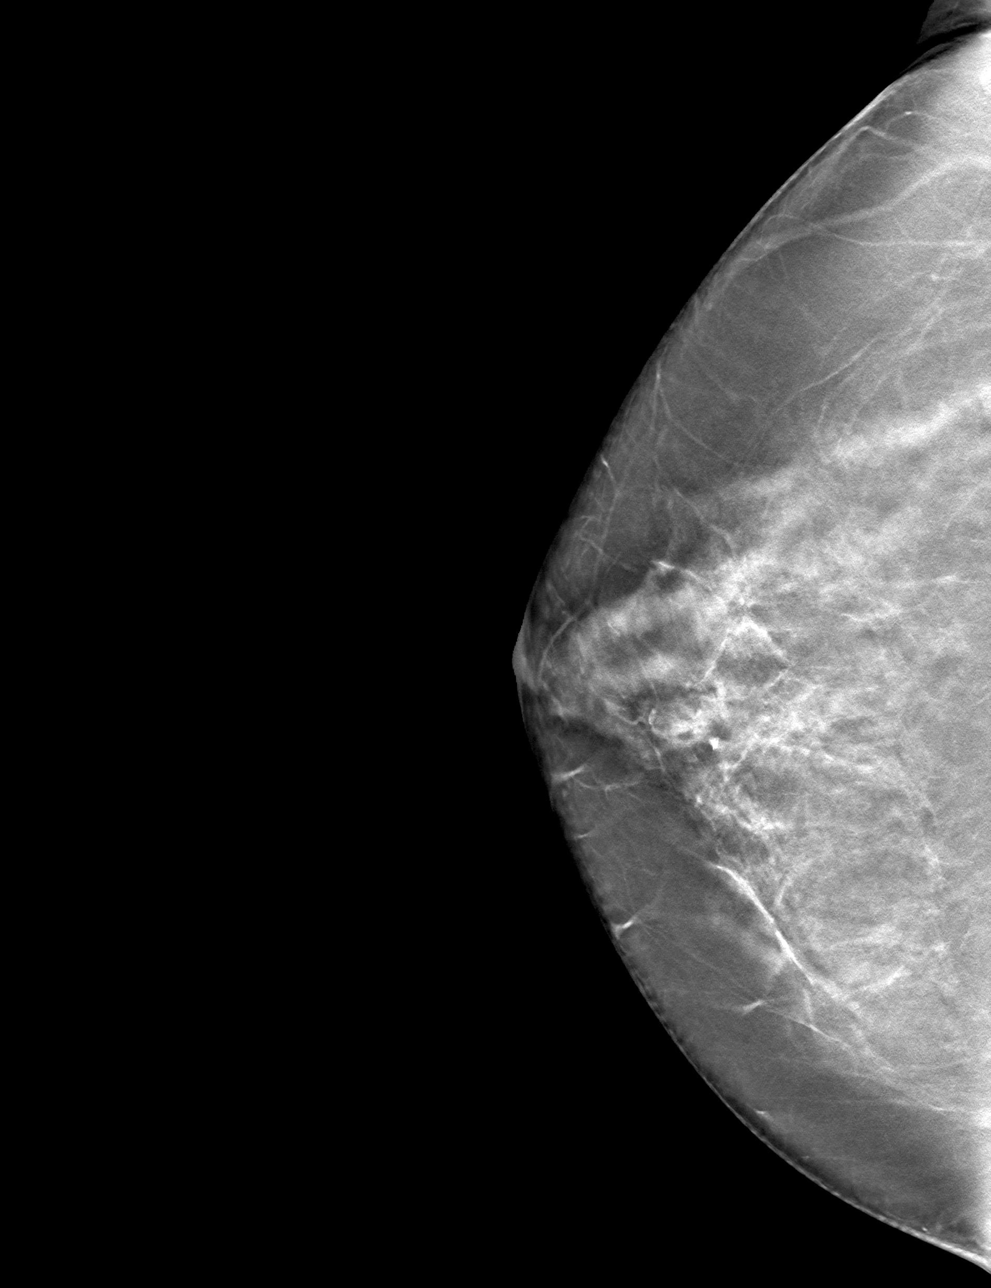

[4 of 12 positions shown; findings below may reference images not displayed]

ACR Breast Density Category c: The breast tissue is heterogeneously
dense, which may obscure small masses.
FINDINGS: The patient has had a left mastectomy. There are no findings
suspicious for malignancy.
IMPRESSION: No mammographic evidence of malignancy. A result letter of this
screening mammogram will be mailed directly to the patient.

RECOMMENDATION:
Screening mammogram in one year.  (Code:V6-Y-3H6)

BI-RADS CATEGORY  1: Negative.

## 2024-02-07 ENCOUNTER — Other Ambulatory Visit: Payer: Self-pay | Admitting: Hematology and Oncology

## 2024-02-07 DIAGNOSIS — Z1231 Encounter for screening mammogram for malignant neoplasm of breast: Secondary | ICD-10-CM

## 2024-03-01 ENCOUNTER — Ambulatory Visit: Payer: No Typology Code available for payment source | Admitting: Hematology and Oncology

## 2024-03-05 NOTE — Assessment & Plan Note (Signed)
 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 Status post mastectomy followed by systemic chemotherapy and is currently on tamoxifen  that started 08/21/2013 the plan of treatment 5-10 years switched June 2019 to Letrozole   completed December 2021       Breast Cancer Surveillance: 1. Breast exam 03/06/2024: Benign 2. Mammogram right breast 03/25/23: Benign breast density category C   Her husband is doing extremely well from the colon cancer recurrence that he had a year ago. Return to clinic in 1 year for follow-up

## 2024-03-06 ENCOUNTER — Inpatient Hospital Stay: Attending: Hematology and Oncology | Admitting: Hematology and Oncology

## 2024-03-06 VITALS — BP 131/64 | HR 73 | Temp 98.0°F | Resp 17 | Ht 65.0 in | Wt 154.7 lb

## 2024-03-06 DIAGNOSIS — Z1721 Progesterone receptor positive status: Secondary | ICD-10-CM | POA: Diagnosis not present

## 2024-03-06 DIAGNOSIS — Z9223 Personal history of estrogen therapy: Secondary | ICD-10-CM | POA: Insufficient documentation

## 2024-03-06 DIAGNOSIS — Z9221 Personal history of antineoplastic chemotherapy: Secondary | ICD-10-CM | POA: Diagnosis not present

## 2024-03-06 DIAGNOSIS — Z9012 Acquired absence of left breast and nipple: Secondary | ICD-10-CM | POA: Insufficient documentation

## 2024-03-06 DIAGNOSIS — Z853 Personal history of malignant neoplasm of breast: Secondary | ICD-10-CM | POA: Insufficient documentation

## 2024-03-06 DIAGNOSIS — Z170 Estrogen receptor positive status [ER+]: Secondary | ICD-10-CM | POA: Diagnosis not present

## 2024-03-06 NOTE — Progress Notes (Signed)
 Patient Care Team: Loreli Elsie JONETTA Mickey., MD as PCP - General (Internal Medicine)  DIAGNOSIS:  Encounter Diagnosis  Name Primary?   Malignant neoplasm of lower-outer quadrant of left breast of female, estrogen receptor positive ([facility]) Yes    SUMMARY OF ONCOLOGIC HISTORY: Oncology History  Breast cancer of lower-outer quadrant of left female breast ([facility])  03/16/2013 Initial Biopsy   Invasive ductal carcinoma grade 1, ER positive, PR positive, HER-2 negative, Ki-67 11%   03/24/2013 Breast MRI   Left breast bloody discharge mammogram mass 3.4 cm at 7:00, MRI revealed mass to be 5 cm   04/13/2013 Surgery   Left mastectomy: invasive ductal carcinoma 4.2 cm, grade 2, intermediate grade DCIS, 3 SLN negative, ER 100%, PR 100%, HER-2 negative ratio, insufficient tissue for Oncotype DX testing: Followed by breast reconstruction by Dr. Deborah   05/25/2013 - 07/28/2013 Chemotherapy   Adjuvant chemotherapy with Taxotere  and Cytoxan  every 21 days x4 cycles   08/21/2013 - 08/15/2020 Anti-estrogen oral therapy   Tamoxifen  20 mg daily switched to letrozole  2019     CHIEF COMPLIANT: Surveillance of breast cancer  HISTORY OF PRESENT ILLNESS:   History of Present Illness Amanda Coleman is a 58 year old female who presents for a follow-up visit.  She experiences occasional sensations in the area of her previous breast surgery, described as a 'little something' rather than significant pain. She has a mammogram scheduled soon.  She recalls undergoing genetic testing in the past, which was negative. Her family history includes ovarian and uterine cancer in her grandmother. She had a hysterectomy many years ago but retains her ovaries.     ALLERGIES:  is allergic to penicillins, sulfa antibiotics, and sulfamethoxazole.  MEDICATIONS:  Current Outpatient Medications  Medication Sig Dispense Refill   ALPRAZolam  (XANAX ) 0.5 MG tablet Take 0.5 mg by mouth as needed for sleep.     levothyroxine   (SYNTHROID ) 112 MCG tablet Take 1 tablet (112 mcg total) by mouth daily before breakfast.     prochlorperazine  (COMPAZINE ) 10 MG tablet Take 1 tablet (10 mg total) by mouth every 6 (six) hours as needed for nausea or vomiting. 30 tablet 3   zolpidem  (AMBIEN ) 10 MG tablet Take 1 tablet (10 mg total) by mouth at bedtime as needed for sleep. 30 tablet 3   No current facility-administered medications for this visit.    PHYSICAL EXAMINATION: ECOG PERFORMANCE STATUS: 1 - Symptomatic but completely ambulatory  Vitals:   03/06/24 1013  BP: 131/64  Pulse: 73  Resp: 17  Temp: 98 F (36.7 C)  SpO2: 100%   Filed Weights   03/06/24 1013  Weight: 154 lb 11.2 oz (70.2 kg)    Physical Exam MEASUREMENTS: Weight- 151.6.  (exam performed in the presence of a chaperone)  LABORATORY DATA:  I have reviewed the data as listed    Latest Ref Rng & Units 07/23/2014    9:34 AM 01/25/2014   10:29 AM 09/12/2013    9:18 AM  CMP  Glucose 70 - 140 mg/dl 89  897  73   BUN 7.0 - 26.0 mg/dL 85.1  88.3  86.0   Creatinine 0.6 - 1.1 mg/dL 0.7  0.8  0.7   Sodium 136 - 145 mEq/L 142  145  144   Potassium 3.5 - 5.1 mEq/L 4.2  3.8  3.9   CO2 22 - 29 mEq/L 28  25  28    Calcium 8.4 - 10.4 mg/dL 9.4  9.2  9.9   Total  Protein 6.4 - 8.3 g/dL 6.5  7.0  6.6   Total Bilirubin 0.20 - 1.20 mg/dL 9.68  9.38  9.61   Alkaline Phos 40 - 150 U/L 67  62  66   AST 5 - 34 U/L 27  20  16    ALT 0 - 55 U/L 40  18  14     Lab Results  Component Value Date   WBC 4.9 07/23/2014   HGB 12.9 07/23/2014   HCT 39.9 07/23/2014   MCV 90.9 07/23/2014   PLT 218 07/23/2014   NEUTROABS 3.3 07/23/2014    ASSESSMENT & PLAN:  Breast cancer of lower-outer quadrant of left female breast ([facility]) 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 Status post mastectomy followed by systemic chemotherapy and is currently  on tamoxifen  that started 08/21/2013 the plan of treatment 5-10 years switched June 2019 to Letrozole   completed December 2021       Breast Cancer Surveillance: 1. Breast exam 03/06/2024: Benign 2. Mammogram right breast 03/25/23: Benign breast density category C   Her husband is doing extremely well from the colon cancer recurrence  I offered her guardant reveal for MRD testing.  She will think about this. Return to clinic in 1 year for follow-up    No orders of the defined types were placed in this encounter.  The patient has a good understanding of the overall plan. she agrees with it. she will call with any problems that may develop before the next visit here. Total time spent: 30 mins including face to face time and time spent for planning, charting and co-ordination of care   Naomi MARLA Chad, MD 03/06/24

## 2024-03-27 ENCOUNTER — Ambulatory Visit
Admission: RE | Admit: 2024-03-27 | Discharge: 2024-03-27 | Disposition: A | Discharge status: Home / Self Care | Source: Ambulatory Visit | Attending: Hematology and Oncology

## 2024-03-27 DIAGNOSIS — Z1231 Encounter for screening mammogram for malignant neoplasm of breast: Secondary | ICD-10-CM

## 2025-03-08 ENCOUNTER — Ambulatory Visit: Admitting: Hematology and Oncology
